# Patient Record
Sex: Female | Born: 1985 | Race: White | Hispanic: No | Marital: Single | Smoking: Current every day smoker
Health system: Southern US, Community
[De-identification: ages and names within clinical notes are randomized; demographics above are authoritative.]

## PROBLEM LIST (undated history)

## (undated) DIAGNOSIS — C539 Malignant neoplasm of cervix uteri, unspecified: Secondary | ICD-10-CM

## (undated) HISTORY — PX: TONSILLECTOMY: SUR1361

## (undated) HISTORY — PX: ABDOMINAL HYSTERECTOMY: SHX81

## (undated) HISTORY — DX: Malignant neoplasm of cervix uteri, unspecified: C53.9

## (undated) HISTORY — PX: CHOLECYSTECTOMY: SHX55

---

## 2004-05-11 ENCOUNTER — Emergency Department (HOSPITAL_COMMUNITY): Admission: EM | Admit: 2004-05-11 | Discharge: 2004-05-11 | Payer: Self-pay | Admitting: Emergency Medicine

## 2004-05-18 ENCOUNTER — Emergency Department (HOSPITAL_COMMUNITY): Admission: EM | Admit: 2004-05-18 | Discharge: 2004-05-18 | Payer: Self-pay | Admitting: Emergency Medicine

## 2006-06-20 ENCOUNTER — Inpatient Hospital Stay (HOSPITAL_COMMUNITY): Admission: AD | Admit: 2006-06-20 | Discharge: 2006-06-20 | Payer: Self-pay | Admitting: Gynecology

## 2006-06-20 ENCOUNTER — Ambulatory Visit: Payer: Self-pay | Admitting: *Deleted

## 2006-06-21 ENCOUNTER — Ambulatory Visit (HOSPITAL_COMMUNITY): Admission: RE | Admit: 2006-06-21 | Discharge: 2006-06-21 | Payer: Self-pay | Admitting: Gynecology

## 2006-09-29 ENCOUNTER — Inpatient Hospital Stay (HOSPITAL_COMMUNITY): Admission: AD | Admit: 2006-09-29 | Discharge: 2006-09-30 | Payer: Self-pay | Admitting: Family Medicine

## 2006-09-29 ENCOUNTER — Ambulatory Visit: Payer: Self-pay | Admitting: *Deleted

## 2007-06-09 ENCOUNTER — Emergency Department (HOSPITAL_COMMUNITY): Admission: EM | Admit: 2007-06-09 | Discharge: 2007-06-09 | Payer: Self-pay | Admitting: Emergency Medicine

## 2010-07-29 ENCOUNTER — Emergency Department (HOSPITAL_COMMUNITY)
Admission: EM | Admit: 2010-07-29 | Discharge: 2010-07-30 | Payer: Self-pay | Source: Home / Self Care | Admitting: Emergency Medicine

## 2010-09-04 ENCOUNTER — Encounter: Payer: Self-pay | Admitting: Gynecology

## 2010-10-24 LAB — URINALYSIS, ROUTINE W REFLEX MICROSCOPIC
Bilirubin Urine: NEGATIVE
Glucose, UA: NEGATIVE mg/dL
Hgb urine dipstick: NEGATIVE
Ketones, ur: NEGATIVE mg/dL
Nitrite: NEGATIVE
Protein, ur: NEGATIVE mg/dL
Specific Gravity, Urine: 1.025 (ref 1.005–1.030)
Urobilinogen, UA: 0.2 mg/dL (ref 0.0–1.0)
pH: 6.5 (ref 5.0–8.0)

## 2010-10-24 LAB — SYPHILIS: RPR W/REFLEX TO RPR TITER AND TREPONEMAL ANTIBODIES, TRADITIONAL SCREENING AND DIAGNOSIS ALGORITHM: RPR Ser Ql: NONREACTIVE

## 2010-10-24 LAB — WET PREP, GENITAL

## 2010-10-24 LAB — PREGNANCY, URINE: Preg Test, Ur: POSITIVE

## 2010-10-24 LAB — ABO/RH: ABO/RH(D): A POS

## 2010-10-24 LAB — HCG, QUANTITATIVE, PREGNANCY: hCG, Beta Chain, Quant, S: 36091 m[IU]/mL — ABNORMAL HIGH (ref <5)

## 2011-05-24 LAB — CBC
HCT: 36.7
MCV: 84.1
Platelets: 242
RDW: 17.2 — ABNORMAL HIGH

## 2011-05-24 LAB — DIFFERENTIAL
Basophils Absolute: 0
Eosinophils Absolute: 0.3
Eosinophils Relative: 4
Lymphs Abs: 2.7

## 2011-05-24 LAB — URINALYSIS, ROUTINE W REFLEX MICROSCOPIC
Hgb urine dipstick: NEGATIVE
Ketones, ur: >80 — AB
Nitrite: NEGATIVE
pH: 6.5

## 2011-05-24 LAB — BASIC METABOLIC PANEL
BUN: 6
Chloride: 105
Glucose, Bld: 127 — ABNORMAL HIGH
Potassium: 3.7

## 2011-05-24 LAB — HCG, QUANTITATIVE, PREGNANCY: hCG, Beta Chain, Quant, S: 51015 — ABNORMAL HIGH

## 2011-05-24 LAB — URINE MICROSCOPIC-ADD ON

## 2017-04-26 ENCOUNTER — Encounter (HOSPITAL_COMMUNITY): Payer: Self-pay | Admitting: *Deleted

## 2017-04-26 ENCOUNTER — Emergency Department (HOSPITAL_COMMUNITY)
Admission: EM | Admit: 2017-04-26 | Discharge: 2017-04-26 | Disposition: A | Discharge status: Home / Self Care | Payer: Medicaid - Out of State | Attending: Emergency Medicine | Admitting: Emergency Medicine

## 2017-04-26 ENCOUNTER — Emergency Department (HOSPITAL_COMMUNITY): Payer: Medicaid - Out of State

## 2017-04-26 DIAGNOSIS — M79673 Pain in unspecified foot: Secondary | ICD-10-CM

## 2017-04-26 DIAGNOSIS — M79672 Pain in left foot: Secondary | ICD-10-CM | POA: Diagnosis not present

## 2017-04-26 DIAGNOSIS — F17200 Nicotine dependence, unspecified, uncomplicated: Secondary | ICD-10-CM | POA: Diagnosis not present

## 2017-04-26 NOTE — ED Provider Notes (Signed)
[facility] DEPT Provider Note   CSN: [id number] Arrival date & time: 04/26/17  1511     History   Chief Complaint Chief Complaint  Patient presents with  . Foot Pain    HPI Vannary A Sidman is a 31 y.o. female who presents to the emergency department with a chief complaint of left foot pain that began 3 weeks ago. She denies known trauma or injury. She states that she thinks the pain began after she was moving a few weeks ago, but is unsure. She reports the pain has been constant and has gradually worsened. She reports her pain is alleviated by resting the foot and aggravated by bearing weight. She denies left ankle or knee pain. No fever or chills. No treatment prior to arrival.  The history is provided by the patient. No language interpreter was used.    History reviewed. No pertinent past medical history.  There are no active problems to display for this patient.   Past Surgical History:  Procedure Laterality Date  . ABDOMINAL HYSTERECTOMY    . CHOLECYSTECTOMY      OB History    No data available       Home Medications    Prior to Admission medications   Not on File    Family History No family history on file.  Social History Social History  Substance Use Topics  . Smoking status: Current Every Day Smoker  . Smokeless tobacco: Never Used  . Alcohol use No     Allergies   Patient has no known allergies.   Review of Systems Review of Systems  Constitutional: Negative for activity change, chills and fever.  Respiratory: Negative for shortness of breath.   Cardiovascular: Negative for chest pain.  Gastrointestinal: Negative for abdominal pain.  Musculoskeletal: Positive for arthralgias, gait problem and myalgias. Negative for back pain and joint swelling.  Skin: Negative for rash.     Physical Exam Updated Vital Signs BP 124/72   Pulse (!) 123   Temp 98.4 F (36.9 C) (Oral)   Resp 18   Ht 5\' 6"  (1.676 m)   Wt 104.3 kg (230 lb)   SpO2  99%   BMI 37.12 kg/m   Physical Exam  Constitutional: No distress.  HENT:  Head: Normocephalic.  Eyes: Conjunctivae are normal.  Neck: Neck supple.  Cardiovascular: Normal rate and regular rhythm.  Exam reveals no gallop and no friction rub.   No murmur heard. Pulmonary/Chest: Effort normal. No respiratory distress.  Abdominal: Soft. She exhibits no distension.  Musculoskeletal:  Tender to palpation over the dorsum of the left foot that begins over the lateral, ulnar aspect and extends diagonally across to the medial aspect, near the patient's hallux. The base of the fifth metatarsal is also TTP. DP and PT pulses are 2+ and symmetric. 5 out of 5 strength against resistance with dorsiflexion and plantar flexion of the bilateral lower extremities. Sensation is intact throughout the bilateral feet. Full range of motion of the bilateral ankles and knees without tenderness.  Neurological: She is alert.  Skin: Skin is warm. No rash noted.  Psychiatric: Her behavior is normal.  Nursing note and vitals reviewed.    ED Treatments / Results  Labs (all labs ordered are listed, but only abnormal results are displayed) Labs Reviewed - No data to display  EKG  EKG Interpretation None       Radiology Dg Foot Complete Left  Result Date: 04/26/2017 CLINICAL DATA:  Left foot pain for 3 weeks  without known injury. EXAM: LEFT FOOT - COMPLETE 3+ VIEW COMPARISON:  None. FINDINGS: There is no evidence of fracture or dislocation. There is no evidence of arthropathy or other focal bone abnormality. Soft tissues are unremarkable. IMPRESSION: Normal left foot. Electronically Signed   By: Marijo Conception, M.D.   On: 04/26/2017 17:01    Procedures Procedures (including critical care time)  Medications Ordered in ED Medications - No data to display   Initial Impression / Assessment and Plan / ED Course  I have reviewed the triage vital signs and the nursing notes.  Pertinent labs & imaging  results that were available during my care of the patient were reviewed by me and considered in my medical decision making (see chart for details).    Patient X-Ray negative for obvious fracture or dislocation. Pt advised to follow up with orthopedics or podiatry once she returns to [place] if symptoms persist. Patient given ace wrap while in ED, conservative therapy recommended and discussed. Patient will be dc home & is agreeable with above plan.  Final Clinical Impressions(s) / ED Diagnoses   Final diagnoses:  Foot pain    New Prescriptions New Prescriptions   No medications on file     Perrin Smack 04/26/17 1716    Nat Christen, MD 04/28/17 1212

## 2017-04-26 NOTE — Discharge Instructions (Signed)
Please follow-up with a podiatrist or orthopedist once he returned to [place].  You can continue to take anti-inflammatories, apply ice, and to wear the Ace bandage for comfort.  If you develop any new or worsening symptoms, including a new injury, if the foot becomes red, hot, or swollen, or if you develop weakness or numbness in the leg, please return to emergency department for reevaluation.

## 2017-04-26 NOTE — ED Triage Notes (Signed)
Left foot pain for 3 weeks

## 2018-08-19 ENCOUNTER — Emergency Department (HOSPITAL_COMMUNITY): Payer: Medicaid Other

## 2018-08-19 ENCOUNTER — Emergency Department (HOSPITAL_COMMUNITY)
Admission: EM | Admit: 2018-08-19 | Discharge: 2018-08-19 | Disposition: A | Discharge status: Home / Self Care | Payer: Medicaid Other | Attending: Emergency Medicine | Admitting: Emergency Medicine

## 2018-08-19 ENCOUNTER — Encounter (HOSPITAL_COMMUNITY): Payer: Self-pay | Admitting: Emergency Medicine

## 2018-08-19 DIAGNOSIS — F17210 Nicotine dependence, cigarettes, uncomplicated: Secondary | ICD-10-CM | POA: Insufficient documentation

## 2018-08-19 DIAGNOSIS — N83202 Unspecified ovarian cyst, left side: Secondary | ICD-10-CM | POA: Insufficient documentation

## 2018-08-19 DIAGNOSIS — R1032 Left lower quadrant pain: Secondary | ICD-10-CM | POA: Diagnosis present

## 2018-08-19 LAB — CBC WITH DIFFERENTIAL/PLATELET
Abs Immature Granulocytes: 0.03 10*3/uL (ref 0.00–0.07)
BASOS PCT: 0 %
Basophils Absolute: 0 10*3/uL (ref 0.0–0.1)
Eosinophils Absolute: 0.3 10*3/uL (ref 0.0–0.5)
Eosinophils Relative: 3 %
HEMATOCRIT: 40 % (ref 36.0–46.0)
HEMOGLOBIN: 13.3 g/dL (ref 12.0–15.0)
Immature Granulocytes: 0 %
LYMPHS ABS: 3 10*3/uL (ref 0.7–4.0)
Lymphocytes Relative: 29 %
MCH: 30.6 pg (ref 26.0–34.0)
MCHC: 33.3 g/dL (ref 30.0–36.0)
MCV: 92.2 fL (ref 80.0–100.0)
MONO ABS: 0.6 10*3/uL (ref 0.1–1.0)
MONOS PCT: 6 %
Neutro Abs: 6.3 10*3/uL (ref 1.7–7.7)
Neutrophils Relative %: 62 %
PLATELETS: 220 10*3/uL (ref 150–400)
RBC: 4.34 MIL/uL (ref 3.87–5.11)
RDW: 13.3 % (ref 11.5–15.5)
WBC: 10.2 10*3/uL (ref 4.0–10.5)
nRBC: 0 % (ref 0.0–0.2)

## 2018-08-19 LAB — COMPREHENSIVE METABOLIC PANEL
ALK PHOS: 64 U/L (ref 38–126)
ALT: 32 U/L (ref 0–44)
AST: 22 U/L (ref 15–41)
Albumin: 3.8 g/dL (ref 3.5–5.0)
Anion gap: 7 (ref 5–15)
BILIRUBIN TOTAL: 0.7 mg/dL (ref 0.3–1.2)
BUN: 13 mg/dL (ref 6–20)
CALCIUM: 9.1 mg/dL (ref 8.9–10.3)
CO2: 23 mmol/L (ref 22–32)
CREATININE: 0.62 mg/dL (ref 0.44–1.00)
Chloride: 106 mmol/L (ref 98–111)
Glucose, Bld: 97 mg/dL (ref 70–99)
Potassium: 3.7 mmol/L (ref 3.5–5.1)
Sodium: 136 mmol/L (ref 135–145)
TOTAL PROTEIN: 6.6 g/dL (ref 6.5–8.1)

## 2018-08-19 LAB — URINALYSIS, ROUTINE W REFLEX MICROSCOPIC
Bilirubin Urine: NEGATIVE
GLUCOSE, UA: NEGATIVE mg/dL
HGB URINE DIPSTICK: NEGATIVE
Ketones, ur: 5 mg/dL — AB
Leukocytes, UA: NEGATIVE
Nitrite: NEGATIVE
PROTEIN: NEGATIVE mg/dL
Specific Gravity, Urine: 1.033 — ABNORMAL HIGH (ref 1.005–1.030)
pH: 5 (ref 5.0–8.0)

## 2018-08-19 MED ORDER — TRAMADOL HCL 50 MG PO TABS: 50.0000 mg | ORAL_TABLET | Freq: Four times a day (QID) | ORAL | 0 refills | Status: DC | PRN

## 2018-08-19 MED ORDER — IOPAMIDOL (ISOVUE-300) INJECTION 61%
100.0000 mL | Freq: Once | INTRAVENOUS | Status: AC | PRN
  Administered 2018-08-19: 100 mL via INTRAVENOUS

## 2018-08-19 MED ORDER — SODIUM CHLORIDE 0.9 % IV BOLUS
500.0000 mL | Freq: Once | INTRAVENOUS | Status: AC
  Administered 2018-08-19: 500 mL via INTRAVENOUS

## 2018-08-19 MED ORDER — ONDANSETRON HCL 4 MG/2ML IJ SOLN
4.0000 mg | Freq: Once | INTRAMUSCULAR | Status: AC
  Administered 2018-08-19: 4 mg via INTRAVENOUS
  Filled 2018-08-19: qty 2

## 2018-08-19 MED ORDER — HYDROMORPHONE HCL 1 MG/ML IJ SOLN
0.5000 mg | Freq: Once | INTRAMUSCULAR | Status: AC
  Administered 2018-08-19: 0.5 mg via INTRAVENOUS
  Filled 2018-08-19: qty 1

## 2018-08-19 NOTE — ED Notes (Signed)
Patient transported to CT 

## 2018-08-19 NOTE — Discharge Instructions (Addendum)
Follow-up with Dr. Glo Herring in the next couple weeks for recheck

## 2018-08-19 NOTE — ED Provider Notes (Signed)
[facility] EMERGENCY DEPARTMENT Provider Note   CSN: [id number] Arrival date & time: 08/19/18  4196     History   Chief Complaint Chief Complaint  Patient presents with  . Abdominal Pain    HPI Marilyn Lopez is a 33 y.o. female.  Patient complains of lower abdominal pain.  Patient has a history of having hysterectomy  The history is provided by the patient. No language interpreter was used.  Abdominal Pain   This is a new problem. The current episode started more than 2 days ago. The problem occurs constantly. The problem has not changed since onset.The pain is associated with an unknown factor. The pain is located in the LLQ and RLQ. The quality of the pain is aching. The pain is at a severity of 5/10. The pain is moderate. Pertinent negatives include anorexia, diarrhea, frequency, hematuria and headaches.    History reviewed. No pertinent past medical history.  There are no active problems to display for this patient.   Past Surgical History:  Procedure Laterality Date  . ABDOMINAL HYSTERECTOMY    . CHOLECYSTECTOMY       OB History   No obstetric history on file.      Home Medications    Prior to Admission medications   Medication Sig Start Date End Date Taking? Authorizing Provider  traMADol (ULTRAM) 50 MG tablet Take 1 tablet (50 mg total) by mouth every 6 (six) hours as needed. 08/19/18   Milton Ferguson, MD    Family History History reviewed. No pertinent family history.  Social History Social History   Tobacco Use  . Smoking status: Current Every Day Smoker    Packs/day: 0.50  . Smokeless tobacco: Never Used  Substance Use Topics  . Alcohol use: No  . Drug use: No     Allergies   Patient has no known allergies.   Review of Systems Review of Systems  Constitutional: Negative for appetite change and fatigue.  HENT: Negative for congestion, ear discharge and sinus pressure.   Eyes: Negative for discharge.  Respiratory: Negative for cough.    Cardiovascular: Negative for chest pain.  Gastrointestinal: Positive for abdominal pain. Negative for anorexia and diarrhea.  Genitourinary: Negative for frequency and hematuria.  Musculoskeletal: Negative for back pain.  Skin: Negative for rash.  Neurological: Negative for seizures and headaches.  Psychiatric/Behavioral: Negative for hallucinations.     Physical Exam Updated Vital Signs BP (!) 126/97 (BP Location: Right Arm)   Pulse 79   Temp 98 F (36.7 C) (Oral)   Resp 18   Ht 5\' 6"  (1.676 m)   Wt 93.4 kg   SpO2 100%   BMI 33.25 kg/m   Physical Exam Vitals signs reviewed.  Constitutional:      Appearance: She is well-developed.  HENT:     Head: Normocephalic.     Nose: Nose normal.  Eyes:     General: No scleral icterus.    Conjunctiva/sclera: Conjunctivae normal.  Neck:     Musculoskeletal: Neck supple.     Thyroid: No thyromegaly.  Cardiovascular:     Rate and Rhythm: Normal rate and regular rhythm.     Heart sounds: No murmur. No friction rub. No gallop.   Pulmonary:     Breath sounds: No stridor. No wheezing or rales.  Chest:     Chest wall: No tenderness.  Abdominal:     General: There is no distension.     Tenderness: There is abdominal tenderness. There is no rebound.  Musculoskeletal: Normal range of motion.  Lymphadenopathy:     Cervical: No cervical adenopathy.  Skin:    Findings: No erythema or rash.  Neurological:     Mental Status: She is oriented to person, place, and time.     Motor: No abnormal muscle tone.     Coordination: Coordination normal.  Psychiatric:        Behavior: Behavior normal.      ED Treatments / Results  Labs (all labs ordered are listed, but only abnormal results are displayed) Labs Reviewed  URINALYSIS, ROUTINE W REFLEX MICROSCOPIC - Abnormal; Notable for the following components:      Result Value   Color, Urine AMBER (*)    APPearance CLOUDY (*)    Specific Gravity, Urine 1.033 (*)    Ketones, ur 5 (*)      All other components within normal limits  CBC WITH DIFFERENTIAL/PLATELET  COMPREHENSIVE METABOLIC PANEL    EKG None  Radiology Ct Abdomen Pelvis W Contrast  Result Date: 08/19/2018 CLINICAL DATA:  Lower pelvic pain.  Abdominal distention. EXAM: CT ABDOMEN AND PELVIS WITH CONTRAST TECHNIQUE: Multidetector CT imaging of the abdomen and pelvis was performed using the standard protocol following bolus administration of intravenous contrast. CONTRAST:  132mL ISOVUE-300 IOPAMIDOL (ISOVUE-300) INJECTION 61% COMPARISON:  None. FINDINGS: Lower chest: Dependent atelectasis. No effusions. Heart is borderline in size. Hepatobiliary: Prior cholecystectomy. Small enhancing area peripherally in the right hepatic lobe measures 9 mm, likely benign process such as hemangioma. No biliary ductal dilatation. Pancreas: No focal abnormality or ductal dilatation. Spleen: No focal abnormality.  Normal size. Adrenals/Urinary Tract: 3 mm stone in the mid to lower pole of the right kidney. No ureteral stones or hydronephrosis bilaterally. Urinary bladder and adrenal glands unremarkable. Stomach/Bowel: Normal appendix. Stomach, large and small bowel grossly unremarkable. Vascular/Lymphatic: No evidence of aneurysm or adenopathy. Reproductive: Prior hysterectomy. 3.5 cm cystic area in the left ovary, likely functional cyst. Other: No free fluid or free air. Musculoskeletal: No acute bony abnormality. IMPRESSION: 3.5 cm left ovarian cyst, likely functional cyst. No acute findings in the abdomen or pelvis. Electronically Signed   By: Rolm Baptise M.D.   On: 08/19/2018 12:24    Procedures Procedures (including critical care time)  Medications Ordered in ED Medications  sodium chloride 0.9 % bolus 500 mL (0 mLs Intravenous Stopped 08/19/18 1207)  ondansetron (ZOFRAN) injection 4 mg (4 mg Intravenous Given 08/19/18 1107)  HYDROmorphone (DILAUDID) injection 0.5 mg (0.5 mg Intravenous Given 08/19/18 1107)  iopamidol (ISOVUE-300) 61 %  injection 100 mL (100 mLs Intravenous Contrast Given 08/19/18 1212)     Initial Impression / Assessment and Plan / ED Course  I have reviewed the triage vital signs and the nursing notes.  Pertinent labs & imaging results that were available during my care of the patient were reviewed by me and considered in my medical decision making (see chart for details).     Labs and urinalysis unremarkable.  CT scan shows large ovarian cyst.  Suspect pain related to her ovarian cyst.  Patient given pain medicine and referred to OB/GYN  Final Clinical Impressions(s) / ED Diagnoses   Final diagnoses:  Cyst of left ovary    ED Discharge Orders         Ordered    traMADol (ULTRAM) 50 MG tablet  Every 6 hours PRN     08/19/18 1259           Milton Ferguson, MD 08/19/18 1304

## 2018-08-19 NOTE — ED Triage Notes (Signed)
Pt states she has been having low pelvic pain since Thursday.  Denies dysuria, hematuria, vaginal discharge, or bleeding.

## 2018-08-19 NOTE — ED Notes (Signed)
ED Provider at bedside. 

## 2018-08-19 NOTE — ED Notes (Signed)
Attempted IV x 1 in right AC, unsuccessful.

## 2018-09-04 ENCOUNTER — Ambulatory Visit: Payer: Medicaid Other | Admitting: Obstetrics and Gynecology

## 2018-09-04 ENCOUNTER — Encounter: Payer: Self-pay | Admitting: Obstetrics and Gynecology

## 2018-09-04 DIAGNOSIS — Z90721 Acquired absence of ovaries, unilateral: Secondary | ICD-10-CM | POA: Diagnosis not present

## 2018-09-04 DIAGNOSIS — Z9079 Acquired absence of other genital organ(s): Secondary | ICD-10-CM | POA: Diagnosis not present

## 2018-09-04 DIAGNOSIS — Z90710 Acquired absence of both cervix and uterus: Secondary | ICD-10-CM | POA: Diagnosis not present

## 2018-09-04 DIAGNOSIS — N83202 Unspecified ovarian cyst, left side: Secondary | ICD-10-CM | POA: Diagnosis not present

## 2018-09-04 NOTE — Progress Notes (Signed)
Patient ID: Marilyn Lopez, female   DOB: 1985-12-23, 33 y.o.   MRN: [id number]   [facility] Visit  @DATE @            Patient name: Marilyn Lopez MRN [id number]  Date of birth: 1986/07/07  CC & HPI:  Marilyn Lopez is a 33 y.o. female presenting today for er f/u of left ovarian cyst shown on CT scan 08/19/2018. She had hysterectomy roughly 4 years ago. She had a partially as well as her right ovary, due to getting "polyps"on the right ovary , (her story) in [place] by Dr. Lacinda Axon . She believed that both of her ovaries were being removed instead of one. Recent episode that sent her to the e/r with pain on left side. Is mildly sore today at present appt and takes around 4 ibuprofen for pain , Almost daily for generalized aches  CT ABDOMEN AND PELVIS:  IMPRESSION: Prior hysterectomy, 3.5 cm left ovarian cyst, likely functional cyst. No acute findings in the abdomen or pelvis. Electronically Signed   By: Rolm Baptise M.D.   On: 08/19/2018 12:24 ROS:  ROS +left functional ovarian cyst -fever  All systems are negative except as noted in the HPI and PMH.   Pertinent History Reviewed:   Reviewed: hysterectomy Medical         Past Medical History:  Diagnosis Date  . Cervical cancer [facility])                               Surgical Hx:    Past Surgical History:  Procedure Laterality Date  . ABDOMINAL HYSTERECTOMY    . CHOLECYSTECTOMY     Medications: Reviewed & Updated - see associated section                       Current Outpatient Medications:  .  traMADol (ULTRAM) 50 MG tablet, Take 1 tablet (50 mg total) by mouth every 6 (six) hours as needed. (Patient not taking: Reported on 09/04/2018), Disp: 20 tablet, Rfl: 0   Social History: Reviewed -  reports that she has been smoking. She has been smoking about 0.50 packs per day. She has never used smokeless tobacco.  Objective Findings:  Vitals: There were no vitals taken for this visit.  PHYSICAL EXAMINATION General  appearance - alert, well appearing, and in no distress and oriented to person, place, and time Mental status - normal mood, behavior, speech, dress, motor activity, and thought processes, affect appropriate to mood  PELVIC NOT DONE Cervix - surgically absent, discussed surgery Uterus - surgically absent, discussed surgery   Assessment & Plan:   A:  1. Left ovarian cyst presumed functional ovarian cyst  P:  1. Estradiol pill 1 mg, daily x6 weeks to see if it reduces the discomfort on the left.  Patient is aware that further interventions are possible including laparoscopy but that that seems excessive at this time 2. F/u PRN   By signing my name below, I, Samul Dada, attest that this documentation has been prepared under the direction and in the presence of Jonnie Kind, MD. Electronically Signed: Milton. 09/04/18. 8:45 AM.  I personally performed the services described in this documentation, which was SCRIBED in my presence. The recorded information has been reviewed and considered accurate. It has been edited as necessary during review. Jonnie Kind, MD

## 2020-05-10 ENCOUNTER — Emergency Department (HOSPITAL_COMMUNITY): Payer: Medicaid Other

## 2020-05-10 ENCOUNTER — Emergency Department (HOSPITAL_COMMUNITY)
Admission: EM | Admit: 2020-05-10 | Discharge: 2020-05-10 | Disposition: A | Discharge status: Home / Self Care | Payer: Medicaid Other | Attending: Emergency Medicine | Admitting: Emergency Medicine

## 2020-05-10 ENCOUNTER — Other Ambulatory Visit: Payer: Self-pay

## 2020-05-10 ENCOUNTER — Encounter (HOSPITAL_COMMUNITY): Payer: Self-pay | Admitting: *Deleted

## 2020-05-10 DIAGNOSIS — R430 Anosmia: Secondary | ICD-10-CM | POA: Diagnosis not present

## 2020-05-10 DIAGNOSIS — R5381 Other malaise: Secondary | ICD-10-CM | POA: Insufficient documentation

## 2020-05-10 DIAGNOSIS — R509 Fever, unspecified: Secondary | ICD-10-CM | POA: Insufficient documentation

## 2020-05-10 DIAGNOSIS — Z8541 Personal history of malignant neoplasm of cervix uteri: Secondary | ICD-10-CM | POA: Insufficient documentation

## 2020-05-10 DIAGNOSIS — B349 Viral infection, unspecified: Secondary | ICD-10-CM

## 2020-05-10 DIAGNOSIS — Z20822 Contact with and (suspected) exposure to covid-19: Secondary | ICD-10-CM | POA: Diagnosis not present

## 2020-05-10 DIAGNOSIS — R05 Cough: Secondary | ICD-10-CM | POA: Insufficient documentation

## 2020-05-10 DIAGNOSIS — F17200 Nicotine dependence, unspecified, uncomplicated: Secondary | ICD-10-CM | POA: Diagnosis not present

## 2020-05-10 DIAGNOSIS — R0789 Other chest pain: Secondary | ICD-10-CM | POA: Diagnosis not present

## 2020-05-10 DIAGNOSIS — R0981 Nasal congestion: Secondary | ICD-10-CM | POA: Diagnosis present

## 2020-05-10 LAB — RESPIRATORY PANEL BY RT PCR (FLU A&B, COVID)
Influenza A by PCR: NEGATIVE
Influenza B by PCR: NEGATIVE
SARS Coronavirus 2 by RT PCR: NEGATIVE

## 2020-05-10 MED ORDER — BENZONATATE 200 MG PO CAPS: 200.0000 mg | ORAL_CAPSULE | Freq: Three times a day (TID) | ORAL | 0 refills | Status: DC | PRN

## 2020-05-10 MED ORDER — ALBUTEROL SULFATE HFA 108 (90 BASE) MCG/ACT IN AERS
2.0000 | INHALATION_SPRAY | Freq: Once | RESPIRATORY_TRACT | Status: AC
  Administered 2020-05-10: 2 via RESPIRATORY_TRACT
  Filled 2020-05-10: qty 6.7

## 2020-05-10 NOTE — Discharge Instructions (Signed)
Your Covid test is pending.  Your results should be available later today or tomorrow.  You may review your results on the MyChart app.  If your Covid test is positive, someone work from the hospital will notify you.  Please quarantine at home for at least 10 days.  You may take Tylenol or ibuprofen as directed if needed for body aches or fever.  Follow-up with your primary care provider or return to the emergency department if you develop any worsening symptoms such as increasing chest pain or shortness of breath.

## 2020-05-10 NOTE — ED Provider Notes (Signed)
[facility] EMERGENCY DEPARTMENT Provider Note   CSN: [id number] Arrival date & time: 05/10/20  8182     History Chief Complaint  Patient presents with  . Nasal Congestion    Marilyn Lopez is a 34 y.o. female.  HPI      Marilyn Lopez is a 34 y.o. female who presents to the Emergency Department complaining of cough, nasal congestion, generalized body aches and malaise.  Symptoms began 3 days ago.  She also reports a loss of smell and has a metallic taste in her mouth.  She reports some tightness of her chest associated with cough, no chest pain or shortness of breath, no fever or chills, no abominal pain, vomiting or diarrhea.  No other household members are sick, she has not been vaccinated for the COVID-19 virus.  She reports history of pneumonia last year that required hospitalization, but does not feel that her symptoms this time are as severe.   Past Medical History:  Diagnosis Date  . Cervical cancer [facility])     Patient Active Problem List   Diagnosis Date Noted  . Ovarian cyst, left 09/04/2018  . S/P abdominal hysterectomy and right salpingo-oophorectomy 09/04/2018    Past Surgical History:  Procedure Laterality Date  . ABDOMINAL HYSTERECTOMY    . CHOLECYSTECTOMY    . TONSILLECTOMY       OB History    Gravida  4   Para  4   Term  4   Preterm      AB      Living  4     SAB      TAB      Ectopic      Multiple      Live Births              Family History  Problem Relation Age of Onset  . Heart disease Father   . Cancer Mother     Social History   Tobacco Use  . Smoking status: Current Every Day Smoker    Packs/day: 0.50  . Smokeless tobacco: Never Used  Vaping Use  . Vaping Use: Never used  Substance Use Topics  . Alcohol use: No  . Drug use: No    Home Medications Prior to Admission medications   Medication Sig Start Date End Date Taking? Authorizing Provider  traMADol (ULTRAM) 50 MG tablet Take 1 tablet (50 mg  total) by mouth every 6 (six) hours as needed. Patient not taking: Reported on 09/04/2018 08/19/18   Milton Ferguson, MD    Allergies    Patient has no known allergies.  Review of Systems   Review of Systems  Constitutional: Negative for appetite change, chills and fever.  HENT: Positive for congestion and rhinorrhea. Negative for ear pain, sore throat and trouble swallowing.   Respiratory: Positive for cough and chest tightness. Negative for shortness of breath and wheezing.   Cardiovascular: Negative for chest pain.  Gastrointestinal: Negative for abdominal pain, diarrhea, nausea and vomiting.  Genitourinary: Negative for decreased urine volume and dysuria.  Musculoskeletal: Negative for arthralgias.  Skin: Negative for rash.  Neurological: Negative for dizziness, weakness and numbness.  Hematological: Negative for adenopathy.    Physical Exam Updated Vital Signs BP 138/86 (BP Location: Right Arm)   Pulse 87   Temp 99.5 F (37.5 C) (Oral)   Resp 18   Ht 5\' 6"  (1.676 m)   Wt 95.3 kg   SpO2 96%   BMI 33.89 kg/m   Physical  Exam Vitals and nursing note reviewed.  Constitutional:      Appearance: Normal appearance. She is not ill-appearing or toxic-appearing.  HENT:     Right Ear: Tympanic membrane and ear canal normal.     Left Ear: Tympanic membrane and ear canal normal.     Mouth/Throat:     Mouth: Mucous membranes are moist.     Pharynx: No oropharyngeal exudate or posterior oropharyngeal erythema.     Comments: Uvula midline nonedematous Cardiovascular:     Rate and Rhythm: Normal rate and regular rhythm.     Pulses: Normal pulses.  Pulmonary:     Effort: Pulmonary effort is normal. No respiratory distress.     Breath sounds: No wheezing or rhonchi.  Abdominal:     Palpations: Abdomen is soft.     Tenderness: There is no abdominal tenderness.  Musculoskeletal:        General: Normal range of motion.     Cervical back: No tenderness.  Lymphadenopathy:      Cervical: No cervical adenopathy.  Skin:    General: Skin is warm.     Findings: No rash.  Neurological:     General: No focal deficit present.     Mental Status: She is alert.     Sensory: No sensory deficit.     Motor: No weakness.     ED Results / Procedures / Treatments   Labs (all labs ordered are listed, but only abnormal results are displayed) Labs Reviewed  RESPIRATORY PANEL BY RT PCR (FLU A&B, COVID)    EKG None  Radiology DG Chest Portable 1 View  Result Date: 05/10/2020 CLINICAL DATA:  Cough and nasal congestion EXAM: PORTABLE CHEST 1 VIEW COMPARISON:  None. FINDINGS: Lungs are well aerated bilaterally. No focal infiltrate or sizable effusion is seen. No bony abnormality is noted. IMPRESSION: No active disease. Electronically Signed   By: Inez Catalina M.D.   On: 05/10/2020 09:59    Procedures Procedures (including critical care time)  Medications Ordered in ED Medications - No data to display  ED Course  I have reviewed the triage vital signs and the nursing notes.  Pertinent labs & imaging results that were available during my care of the patient were reviewed by me and considered in my medical decision making (see chart for details).    MDM Rules/Calculators/A&P                          Patient here with symptoms concerning for Covid infection.  She is well-appearing, nontoxic.  Vital signs are reassuring.  Low-grade fever without tachycardia, hypoxia or tachypnea.  Lung sounds are slightly coarse, has history of pneumonia last year that required hospitalization.  Will obtain Covid test and chest x-ray.  On recheck, patient resting comfortably.  States she is ready for discharge home.  Chest x-ray unremarkable, Covid and influenza tests are pending.  She agrees to review results on the MyChart app.  Will dispense albuterol MDI for home use and prescription written for antitussive medication.  Return precautions were discussed.  She is appropriate for  discharge home.  Upon disposition, Covid and flu test have resulted and are negative.  Results discussed with patient.     Marilyn Lopez was evaluated in Emergency Department on 05/10/2020 for the symptoms described in the history of present illness. She was evaluated in the context of the global COVID-19 pandemic, which necessitated consideration that the patient might be at risk  for infection with the SARS-CoV-2 virus that causes COVID-19. Institutional protocols and algorithms that pertain to the evaluation of patients at risk for COVID-19 are in a state of rapid change based on information released by regulatory bodies including the CDC and federal and state organizations. These policies and algorithms were followed during the patient's care in the ED.  Final Clinical Impression(s) / ED Diagnoses Final diagnoses:  None    Rx / DC Orders ED Discharge Orders    None       Bufford Lope 05/10/20 1739    Noemi Chapel, MD 05/15/20 [phone]

## 2020-05-10 NOTE — ED Triage Notes (Signed)
Pt c/o nasal congestion and "copper" taste in her mouth x 3 days ago

## 2020-08-14 IMAGING — CT CT ABD-PELV W/ CM
2 of 4 series · 17 of 46 positions shown, 19 images · IV contrast (Isovue)
Comparison: None.

CLINICAL DATA: Lower pelvic pain.  Abdominal distention.

EXAM:
CT ABDOMEN AND PELVIS WITH CONTRAST
TECHNIQUE: Multidetector CT imaging of the abdomen and pelvis was performed
using the standard protocol following bolus administration of
intravenous contrast.
CONTRAST:  100mL HJ9IL0-8HH IOPAMIDOL (HJ9IL0-8HH) INJECTION 61%

[Series 2: axial st · axial · 0.74mm/px · z∈[+908,+1373]mm · 14 of 103 slices shown, 16 images]
[im 5/103  soft-tissue]
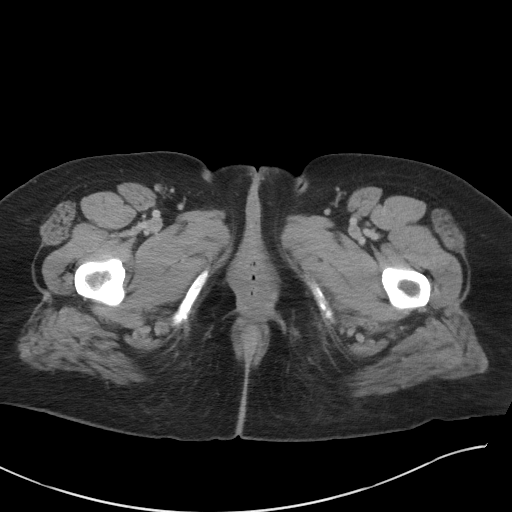
[im 5/103  bone]
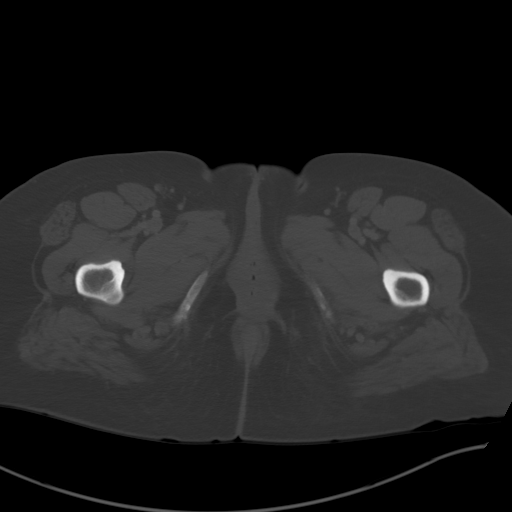
[im 14/103  soft-tissue]
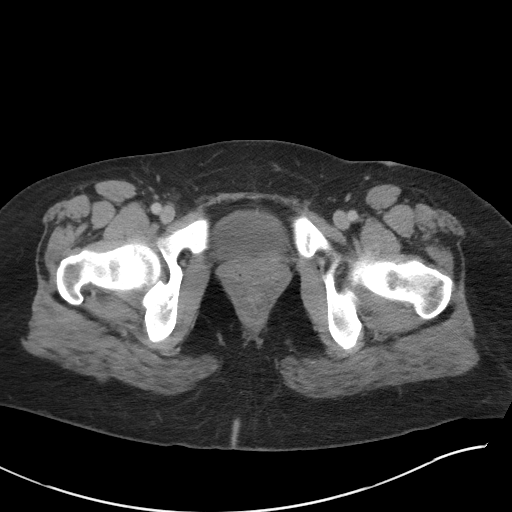
[im 18/103  soft-tissue]
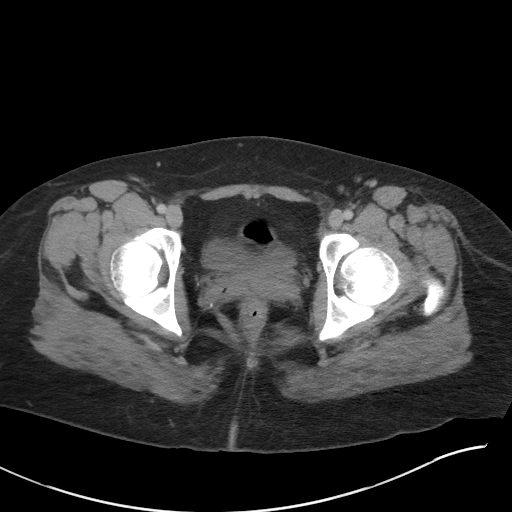
[im 27/103  soft-tissue]
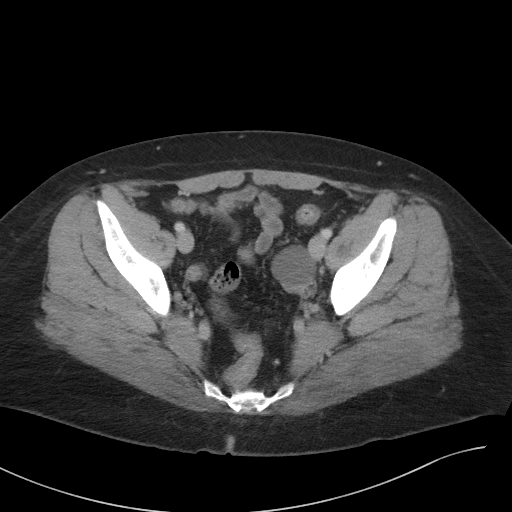
[im 36/103  soft-tissue]
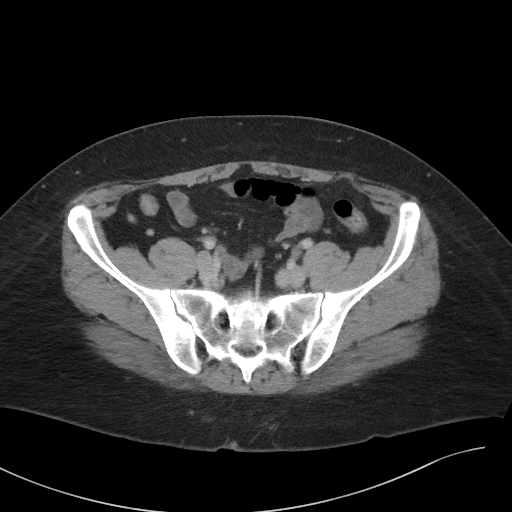
[im 40/103  soft-tissue]
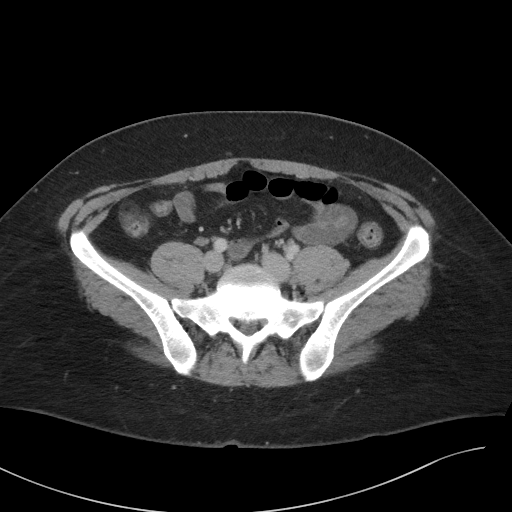
[im 49/103  soft-tissue]
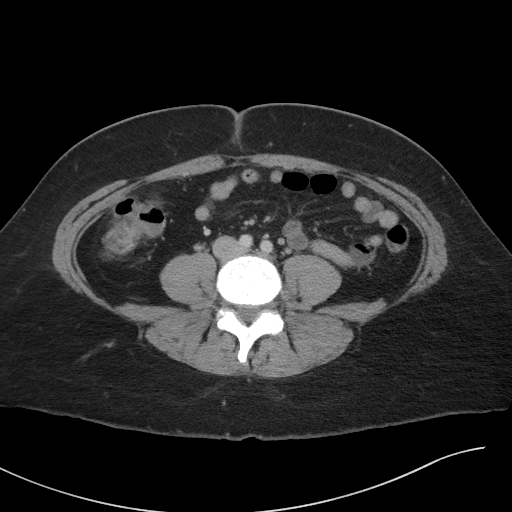
[im 54/103  soft-tissue]
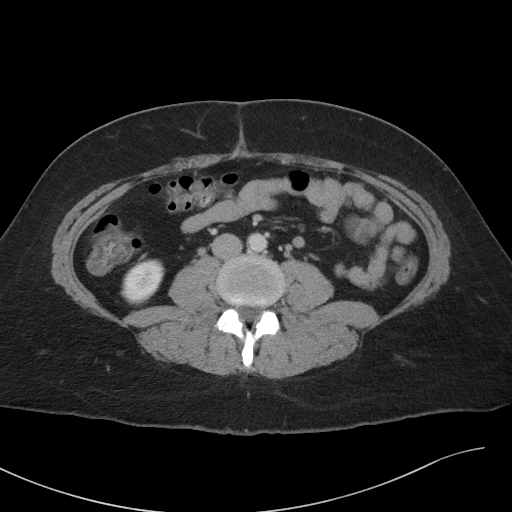
[im 63/103  soft-tissue]
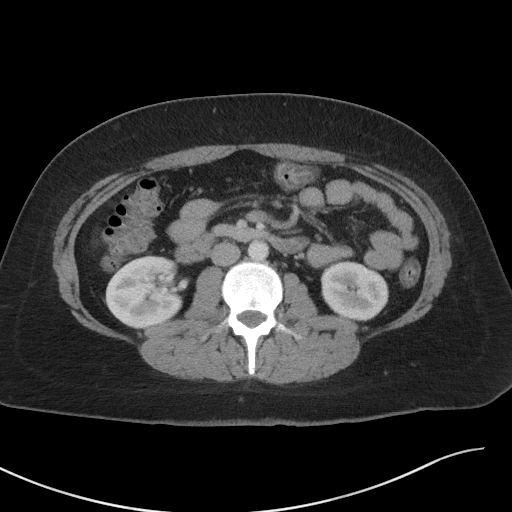
[im 63/103  bone]
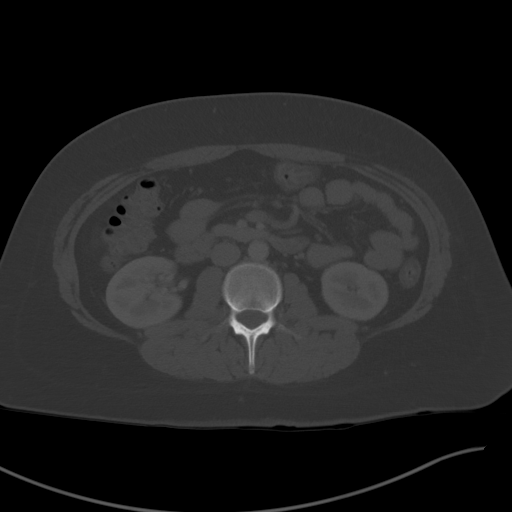
[im 67/103  soft-tissue]
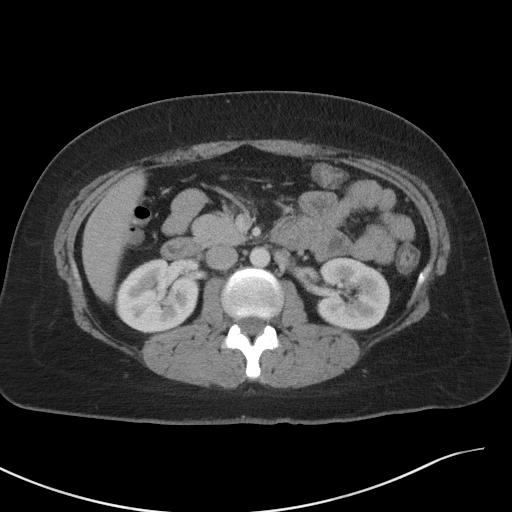
[im 76/103  soft-tissue]
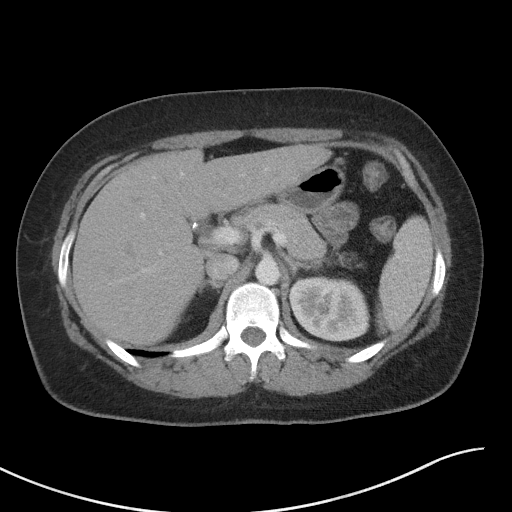
[im 85/103  soft-tissue]
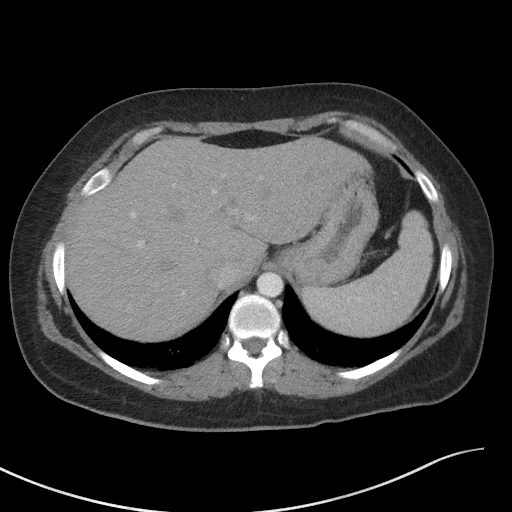
[im 89/103  soft-tissue]
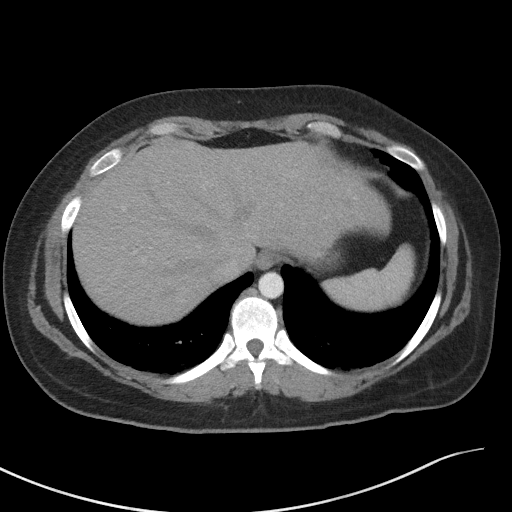
[im 98/103  soft-tissue]
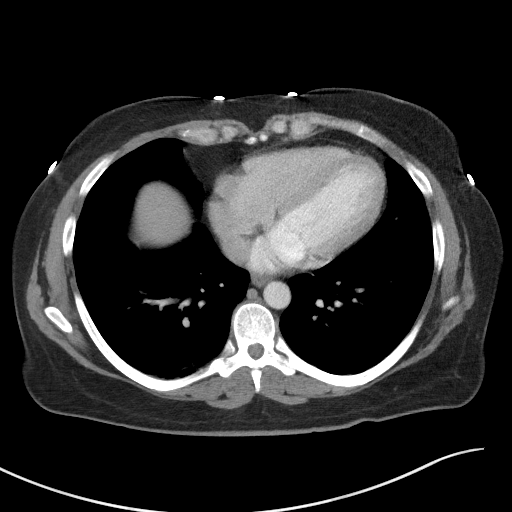

[Series 5: coronal st · coronal · 0.89mm/px · 3 of 85 slices shown]
[im 29/85  soft-tissue]
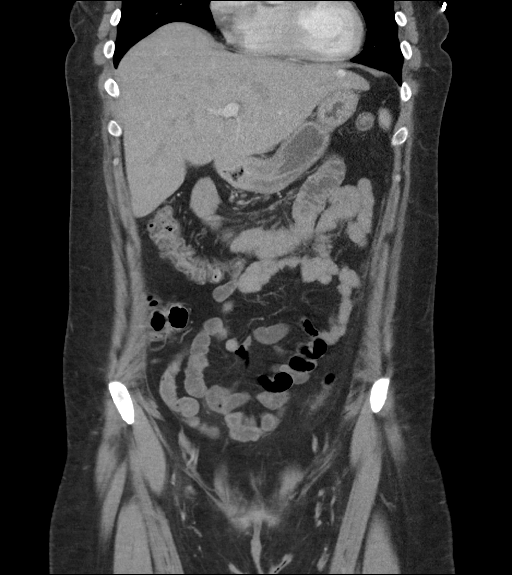
[im 38/85  soft-tissue]
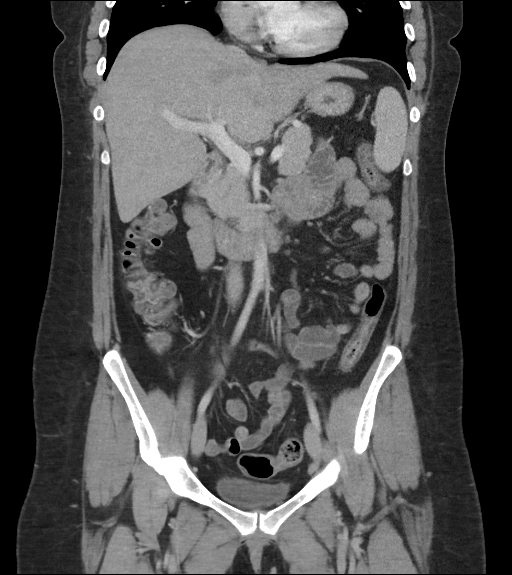
[im 47/85  soft-tissue]
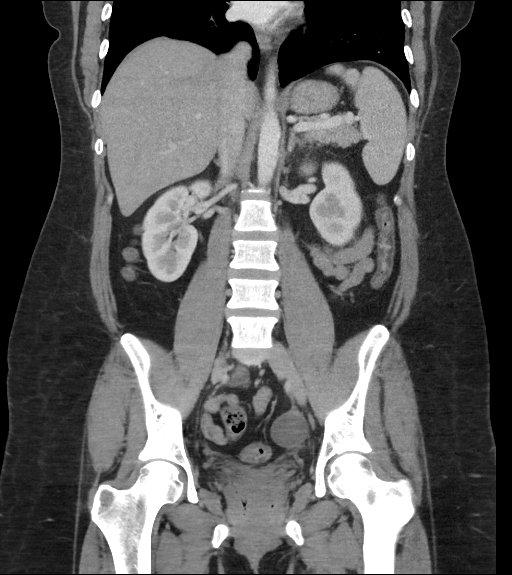

[17 of 46 positions shown; findings below may reference images not displayed]

FINDINGS: Lower chest: Dependent atelectasis. No effusions. Heart is
borderline in size.

Hepatobiliary: Prior cholecystectomy. Small enhancing area
peripherally in the right hepatic lobe measures 9 mm, likely benign
process such as hemangioma. No biliary ductal dilatation.

Pancreas: No focal abnormality or ductal dilatation.

Spleen: No focal abnormality.  Normal size.

Adrenals/Urinary Tract: 3 mm stone in the mid to lower pole of the
right kidney. No ureteral stones or hydronephrosis bilaterally.
Urinary bladder and adrenal glands unremarkable.

Stomach/Bowel: Normal appendix. Stomach, large and small bowel
grossly unremarkable.

Vascular/Lymphatic: No evidence of aneurysm or adenopathy.

Reproductive: Prior hysterectomy. 3.5 cm cystic area in the left
ovary, likely functional cyst.

Other: No free fluid or free air.

Musculoskeletal: No acute bony abnormality.
IMPRESSION: 3.5 cm left ovarian cyst, likely functional cyst.

No acute findings in the abdomen or pelvis.

## 2020-10-20 ENCOUNTER — Other Ambulatory Visit: Payer: Self-pay

## 2020-10-20 ENCOUNTER — Ambulatory Visit (HOSPITAL_COMMUNITY)
Admission: EM | Admit: 2020-10-20 | Discharge: 2020-10-20 | Disposition: A | Discharge status: Home / Self Care | Payer: Medicaid Other | Attending: Family Medicine | Admitting: Family Medicine

## 2020-10-20 ENCOUNTER — Encounter (HOSPITAL_COMMUNITY): Payer: Self-pay

## 2020-10-20 DIAGNOSIS — B029 Zoster without complications: Secondary | ICD-10-CM | POA: Diagnosis not present

## 2020-10-20 MED ORDER — VALACYCLOVIR HCL 1 G PO TABS: 1000.0000 mg | ORAL_TABLET | Freq: Three times a day (TID) | ORAL | 0 refills | Status: DC

## 2020-10-20 MED ORDER — CAPSAICIN 0.075 % EX CREA: 1.0000 "application " | TOPICAL_CREAM | Freq: Two times a day (BID) | CUTANEOUS | 0 refills | Status: DC | PRN

## 2020-10-20 NOTE — ED Triage Notes (Signed)
Pt c/o itchy, painful, "blister" rash to posterior left thigh for approx 4 days. Also reports significant muscle pain, decreased appetite.   Reports h/o chicken pox and staph skin infection to abdomen. Last dose ibuprofen 600mg  at 0900 and again at approx 1200. Education provided regarding safe dosages of ibuprofen.

## 2020-10-20 NOTE — ED Provider Notes (Signed)
  [place]   [id number] 10/20/20 Arrival Time: 1122  ASSESSMENT & PLAN:  1. Herpes zoster without complication     Meds ordered this encounter  Medications  . valACYclovir (VALTREX) 1000 MG tablet    Sig: Take 1 tablet (1,000 mg total) by mouth 3 (three) times daily.    Dispense:  21 tablet    Refill:  0  . capsicum (ZOSTRIX) 0.075 % topical cream    Sig: Apply 1 application topically 2 (two) times daily as needed.    Dispense:  28.3 g    Refill:  0    OTC analgesics as necessary.  Will follow up with PCP or here if worsening or failing to improve as anticipated. Reviewed expectations re: course of current medical issues. Questions answered. Outlined signs and symptoms indicating need for more acute intervention. Patient verbalized understanding. After Visit Summary given.   SUBJECTIVE:  Marilyn Lopez is a 35 y.o. female who presents with a skin complaint. Reports itchy, painful, "blister" rash to posterior left thigh for approx 4 days. Also reports significant muscle pain, decreased appetite. Afebrile. Ibuprofen with mild help. No extremity sensation changes or weakness.     OBJECTIVE: Vitals:   10/20/20 1210  BP: 127/85  Pulse: 83  Resp: 18  Temp: 97.9 F (36.6 C)  TempSrc: Oral  SpO2: 100%    General appearance: alert; no distress HEENT: [place]; AT Neck: supple with FROM Extremities: no edema; moves all extremities normally Skin: warm and dry; crops of red/purplish papules over upper lateral left thigh; no signs of infection Psychological: alert and cooperative; normal mood and affect  Allergies  Allergen Reactions  . Hydrocodone-Acetaminophen Nausea Only  . Oxycodone-Acetaminophen Nausea Only    Past Medical History:  Diagnosis Date  . Cervical cancer [facility])    Social History   Socioeconomic History  . Marital status: Single    Spouse name: Not on file  . Number of children: Not on file  . Years of education: Not on file  . Highest  education level: Not on file  Occupational History  . Not on file  Tobacco Use  . Smoking status: Current Every Day Smoker    Packs/day: 0.50  . Smokeless tobacco: Never Used  Vaping Use  . Vaping Use: Never used  Substance and Sexual Activity  . Alcohol use: No  . Drug use: No  . Sexual activity: Yes    Birth control/protection: None, Surgical  Other Topics Concern  . Not on file  Social History Narrative  . Not on file   Social Determinants of Health   Financial Resource Strain: Not on file  Food Insecurity: Not on file  Transportation Needs: Not on file  Physical Activity: Not on file  Stress: Not on file  Social Connections: Not on file  Intimate Partner Violence: Not on file   Family History  Problem Relation Age of Onset  . Heart disease Father   . Cancer Mother    Past Surgical History:  Procedure Laterality Date  . ABDOMINAL HYSTERECTOMY    . CHOLECYSTECTOMY    . Evonnie Dawes, MD 10/20/20 1226

## 2021-03-25 DIAGNOSIS — N83202 Unspecified ovarian cyst, left side: Secondary | ICD-10-CM | POA: Diagnosis not present

## 2021-03-25 DIAGNOSIS — R1032 Left lower quadrant pain: Secondary | ICD-10-CM | POA: Diagnosis not present

## 2021-03-25 DIAGNOSIS — A5903 Trichomonal cystitis and urethritis: Secondary | ICD-10-CM | POA: Diagnosis not present

## 2021-03-25 DIAGNOSIS — N390 Urinary tract infection, site not specified: Secondary | ICD-10-CM | POA: Diagnosis not present

## 2021-06-16 DIAGNOSIS — Z0389 Encounter for observation for other suspected diseases and conditions ruled out: Secondary | ICD-10-CM | POA: Diagnosis not present

## 2021-07-19 DIAGNOSIS — W010XXA Fall on same level from slipping, tripping and stumbling without subsequent striking against object, initial encounter: Secondary | ICD-10-CM | POA: Diagnosis not present

## 2021-07-19 DIAGNOSIS — S5001XA Contusion of right elbow, initial encounter: Secondary | ICD-10-CM | POA: Diagnosis not present

## 2021-08-10 ENCOUNTER — Other Ambulatory Visit (HOSPITAL_COMMUNITY): Payer: Self-pay

## 2021-08-10 ENCOUNTER — Telehealth: Payer: Self-pay

## 2021-08-10 NOTE — Telephone Encounter (Signed)
[facility] Patient Teacher, English as a foreign language completed.    The patient is insured through [facility] .  Medication will need a PA   We will continue to follow to see if copay assistance is needed.  Ileene Patrick, [facility] Patient [facility] for Infectious Disease Phone: [phone] Fax:  [phone]

## 2021-08-11 ENCOUNTER — Other Ambulatory Visit: Payer: Self-pay

## 2021-08-11 ENCOUNTER — Ambulatory Visit (INDEPENDENT_AMBULATORY_CARE_PROVIDER_SITE_OTHER): Payer: Medicaid Other | Admitting: Infectious Diseases

## 2021-08-11 VITALS — BP 124/81 | HR 94 | Temp 98.2°F | Wt 207.0 lb

## 2021-08-11 DIAGNOSIS — F17200 Nicotine dependence, unspecified, uncomplicated: Secondary | ICD-10-CM | POA: Diagnosis not present

## 2021-08-11 DIAGNOSIS — Z789 Other specified health status: Secondary | ICD-10-CM

## 2021-08-11 DIAGNOSIS — B182 Chronic viral hepatitis C: Secondary | ICD-10-CM

## 2021-08-11 DIAGNOSIS — F1490 Cocaine use, unspecified, uncomplicated: Secondary | ICD-10-CM | POA: Diagnosis not present

## 2021-08-11 NOTE — Progress Notes (Addendum)
[facility] for [facility]                                      [address], [place], [place], [address]                                               Phn. [phone]; Fax: [phone]                                                               Date: 08/11/21 Reason for Visit: Hepatitis C    HPI: Marilyn Lopez is a 35 y.o.old female with PMH of self reported anxiety/depression ( not on meds, follows therapist)cervical ca s/p hysterectomy and h/o substance use who is referred from HD for evaluation and management of hepatitis C.   She knew about positive Hep C status 3 months ago when she [facility]. Her ex husband was hep C positive and she was sexually active with him without protection ( vaginal and anal sex per HD notes). She also used to share oxycontin pills with him and snorted cocaine in the past. Denies using IVDU ever. Says she has been clean from illicit drugs for 12 years now. Says she has been tested multiple times for HCV while donating blood and was never told about her hep C and she is surprised she has positive Hep C 3 months ago. Denies receiving treatment for Hep C. Denies family h/o liver diseases or liver ca.  Smokes 1/2 pack of cigarettes daily, drinks alcohol occasionally, reports being clean from illicit drugs for last 12 years. Lives with her mother and 5 children.   Denies any hospitalizations related to liver disease, jaundice, ascites, GI bleeding, mental status changes, abdominal pain and acholic stool.   Always feels nauseous, alternates between loose stool and regular BM. No changes in appetite and weight.   ROS: Denies fever, chills, nightsweats, vomiting, constipation, weight loss, rashes, joint complaints, shortness of breath, chest pain, headaches, dysuria .  Current Outpatient Medications on File Prior to Visit  Medication Sig Dispense Refill   ibuprofen (ADVIL) 800 MG  tablet Take by mouth.     No current facility-administered medications on file prior to visit.    Allergies  Allergen Reactions   Hydrocodone-Acetaminophen Nausea Only   Oxycodone-Acetaminophen Nausea Only   Past Medical History:  Diagnosis Date   Cervical cancer ([place])    Past Surgical History:  Procedure Laterality Date   ABDOMINAL HYSTERECTOMY     CHOLECYSTECTOMY     TONSILLECTOMY     Social History   Socioeconomic History   Marital status: Single    Spouse name: Not on file   Number of children: Not on file   Years of education: Not on file   Highest education level: Not on file  Occupational History   Not on file  Tobacco Use   Smoking status: Every Day    Packs/day: 0.50    Types: Cigarettes   Smokeless tobacco: Never  Vaping Use   Vaping Use: Never used  Substance and Sexual Activity   Alcohol use: No   Drug use: No   Sexual activity: Yes    Birth control/protection: None, Surgical  Other Topics Concern   Not on file  Social History Narrative   Not on file   Social Determinants of Health   Financial Resource Strain: Not on file  Food Insecurity: Not on file  Transportation Needs: Not on file  Physical Activity: Not on file  Stress: Not on file  Social Connections: Not on file  Intimate Partner Violence: Not on file   Family History  Problem Relation Age of Onset   Heart disease Father    Cancer Mother     Vitals  BP 124/81    Pulse 94    Temp 98.2 F (36.8 C) (Oral)    Wt 207 lb (93.9 kg)    SpO2 99%    BMI 33.41 kg/m   Gen: Alert and oriented x 3, no acute distress HEENT: [place]/AT, no scleral icterus, no pale conjunctivae, hearing normal, oral mucosa moist Neck: Supple, no lymphadenopathy Cardio: Regular rate and rhythm; +S1 and S2; no murmurs Resp: CTAB GI: Soft, nontender, nondistended, bowel sounds present GU: Musc: Extremities: No pedal edema Skin: No rashes, lesions Neuro: grossly non focal  Psych: Calm,  cooperative   Laboratory  06/16/21 HCV RNA detected   Problem List Items Addressed This Visit       Digestive   Chronic hepatitis C without hepatic coma ([place]) - Primary   Relevant Orders   Hepatitis C RNA quantitative     Other   Smoking   Alcohol use   Cocaine use    Assessment/Plan: Chronic Hepatitis C Will get HCV RNA first to get VL. If positive, will get additional labs for HCV treatment planning  FU in a week   Smoking/Alcohol use Counseled   Counseling done on the following -Natural progression of hep c, transmission (avoid sharing personal hygiene equipment), prevention, risks of left untreated and treatment options  -Avoid hepatotoxins like alcohol and excessive acetamaminphen (no more than 2 gram a day) -Avoid eating raw sea food -Risks of re-infection  -Hepatitis coinfection and vaccination   I have personally spent 70  minutes involved in face-to-face and non-face-to-face activities for this patient on the day of the visit. Professional time spent includes the following activities: Preparing to see the patient (review of tests), Obtaining and/or reviewing separately obtained history (admission/discharge record), Performing a medically appropriate examination and/or evaluation , Ordering medications/tests/procedures, referring and communicating with other health care professionals, Documenting clinical information in the EMR, Independently interpreting results (not separately reported), Communicating results to the patient/family/caregiver, Counseling and educating the patient/family/caregiver and Care coordination (not separately reported).   Patients questions were addressed and answered.   Electronically signed by:  Rosiland Oz, MD Infectious Diseases  Office phone [phone] Fax no. [phone]

## 2021-08-15 LAB — HEPATITIS C RNA QUANTITATIVE
HCV Quantitative Log: 6.26 log IU/mL — ABNORMAL HIGH
HCV RNA, PCR, QN: 1800000 IU/mL — ABNORMAL HIGH

## 2021-08-24 ENCOUNTER — Ambulatory Visit (INDEPENDENT_AMBULATORY_CARE_PROVIDER_SITE_OTHER): Payer: Medicaid Other | Admitting: Infectious Diseases

## 2021-08-24 ENCOUNTER — Other Ambulatory Visit: Payer: Self-pay

## 2021-08-24 VITALS — BP 138/80 | HR 92 | Resp 16 | Ht 66.0 in | Wt 206.0 lb

## 2021-08-24 DIAGNOSIS — F1490 Cocaine use, unspecified, uncomplicated: Secondary | ICD-10-CM | POA: Diagnosis not present

## 2021-08-24 DIAGNOSIS — B182 Chronic viral hepatitis C: Secondary | ICD-10-CM

## 2021-08-24 DIAGNOSIS — F17200 Nicotine dependence, unspecified, uncomplicated: Secondary | ICD-10-CM | POA: Diagnosis not present

## 2021-08-24 DIAGNOSIS — Z23 Encounter for immunization: Secondary | ICD-10-CM | POA: Diagnosis not present

## 2021-08-24 NOTE — Progress Notes (Signed)
[facility] for Infectious [facility]                                      [address], [place], [place], [address]                                               Phn. [phone]; Fax: [phone]                                                               Date: 08/24/21 Reason for Follow Up: Hepatitis   Subjective  Here for fu for need for further labs for Hepatitis C for treatment planning. She is tearful about hepatitis C diagnosis and consequences of prior IVDU. Counseled and provided emotional support. No complaints otherwise.   ROS done - negative excerpt as above   Current Outpatient Medications on File Prior to Visit  Medication Sig Dispense Refill   ibuprofen (ADVIL) 800 MG tablet Take by mouth.     No current facility-administered medications on file prior to visit.    Allergies  Allergen Reactions   Hydrocodone-Acetaminophen Nausea Only   Oxycodone-Acetaminophen Nausea Only   Past Medical History:  Diagnosis Date   Cervical cancer ([place])    Past Surgical History:  Procedure Laterality Date   ABDOMINAL HYSTERECTOMY     CHOLECYSTECTOMY     TONSILLECTOMY     Social History   Socioeconomic History   Marital status: Single    Spouse name: Not on file   Number of children: Not on file   Years of education: Not on file   Highest education level: Not on file  Occupational History   Not on file  Tobacco Use   Smoking status: Every Day    Packs/day: 0.50    Types: Cigarettes   Smokeless tobacco: Never  Vaping Use   Vaping Use: Never used  Substance and Sexual Activity   Alcohol use: No   Drug use: No   Sexual activity: Yes    Birth control/protection: None, Surgical  Other Topics Concern   Not on file  Social History Narrative   Not on file   Social Determinants of Health   Financial Resource Strain: Not on file  Food Insecurity: Not on file  Transportation Needs: Not on file  Physical Activity:  Not on file  Stress: Not on file  Social Connections: Not on file  Intimate Partner Violence: Not on file   Family History  Problem Relation Age of Onset   Heart disease Father    Cancer Mother     Vitals  BP 138/80    Pulse 92    Resp 16    Ht 5\' 6"  (1.676 m)    Wt 206 lb (93.4 kg)    SpO2 99%    BMI 33.25 kg/m   Sitting in the chair HEENT - WNL Awake, alert and oriented  Ambulatory  Laboratory  06/16/21 HCV RNA detected   Hepatitis C RNA quantitative Latest Ref Rng & Units 08/11/2021  HCV Quantitative Log log IU/mL 6.26(H)  Problem List Items Addressed This Visit       Digestive   Chronic hepatitis C without hepatic coma ([place]) - Primary   Relevant Orders   CBC   Hepatic function panel   Hepatitis C genotype   Liver Fibrosis, FibroTest-ActiTest   Protime-INR   HIV Antibody (routine testing w rflx)   Hepatitis A antibody, total   Hepatitis B core antibody, total   Hepatitis B surface antibody,qualitative   Hepatitis B surface antigen   US ABDOMEN COMPLETE W/ELASTOGRAPHY    Assessment/Plan: Chronic Hepatitis C In the setting of IVDU Will get additional labs for HCV tx planning as below Fu in 4 weeks  Orders Placed This Encounter  Procedures   US Abdomen Complete   CBC   Hepatic function panel   Hepatitis C genotype   Liver Fibrosis, FibroTest-ActiTest   Protime-INR   HIV Antibody (routine testing w rflx)   Hepatitis A antibody, total   Hepatitis B core antibody, total   Hepatitis B surface antibody,qualitative   Hepatitis B surface antigen   Smoking Counseled   Need for Hep A and B immunization  Check for Hepatitis A and B serology   I have personally spent 30 minutes involved in face-to-face and non-face-to-face activities for this patient on the day of the visit.   Patients questions were addressed and answered.   Electronically signed by:  Rosiland Oz, MD Infectious Diseases  Office phone [phone] Fax no. [phone]

## 2021-08-31 LAB — LIVER FIBROSIS, FIBROTEST-ACTITEST
ALT: 17 U/L (ref 6–29)
Alpha-2-Macroglobulin: 272 mg/dL (ref 106–279)
Apolipoprotein A1: 149 mg/dL (ref 101–198)
Bilirubin: 0.3 mg/dL (ref 0.2–1.2)
Fibrosis Score: 0.1
GGT: 15 U/L (ref 3–50)
Haptoglobin: 172 mg/dL (ref 43–212)
Necroinflammat ACT Score: 0.05
Reference ID: 4188916

## 2021-08-31 LAB — HEPATIC FUNCTION PANEL
AG Ratio: 1.6 (calc) (ref 1.0–2.5)
ALT: 17 U/L (ref 6–29)
AST: 16 U/L (ref 10–30)
Albumin: 4.4 g/dL (ref 3.6–5.1)
Alkaline phosphatase (APISO): 75 U/L (ref 31–125)
Bilirubin, Direct: 0.1 mg/dL (ref 0.0–0.2)
Globulin: 2.7 g/dL (calc) (ref 1.9–3.7)
Indirect Bilirubin: 0.2 mg/dL (calc) (ref 0.2–1.2)
Total Bilirubin: 0.3 mg/dL (ref 0.2–1.2)
Total Protein: 7.1 g/dL (ref 6.1–8.1)

## 2021-08-31 LAB — CBC
HCT: 44.1 % (ref 35.0–45.0)
Hemoglobin: 15 g/dL (ref 11.7–15.5)
MCH: 31.6 pg (ref 27.0–33.0)
MCHC: 34 g/dL (ref 32.0–36.0)
MCV: 93 fL (ref 80.0–100.0)
MPV: 9.7 fL (ref 7.5–12.5)
Platelets: 240 10*3/uL (ref 140–400)
RBC: 4.74 10*6/uL (ref 3.80–5.10)
RDW: 12.5 % (ref 11.0–15.0)
WBC: 10.8 10*3/uL (ref 3.8–10.8)

## 2021-08-31 LAB — HIV ANTIBODY (ROUTINE TESTING W REFLEX): HIV 1&2 Ab, 4th Generation: NONREACTIVE

## 2021-08-31 LAB — HEPATITIS B SURFACE ANTIGEN: Hepatitis B Surface Ag: NONREACTIVE

## 2021-08-31 LAB — PROTIME-INR
INR: 1
Prothrombin Time: 9.8 s (ref 9.0–11.5)

## 2021-08-31 LAB — HEPATITIS B SURFACE ANTIBODY,QUALITATIVE: Hep B S Ab: NONREACTIVE

## 2021-08-31 LAB — HEPATITIS C GENOTYPE

## 2021-08-31 LAB — HEPATITIS B CORE ANTIBODY, TOTAL: Hep B Core Total Ab: NONREACTIVE

## 2021-08-31 LAB — HEPATITIS A ANTIBODY, TOTAL: Hepatitis A AB,Total: NONREACTIVE

## 2021-09-20 ENCOUNTER — Other Ambulatory Visit: Payer: Self-pay

## 2021-09-20 ENCOUNTER — Ambulatory Visit (HOSPITAL_COMMUNITY)
Admission: RE | Admit: 2021-09-20 | Discharge: 2021-09-20 | Disposition: A | Discharge status: Home / Self Care | Payer: Medicaid Other | Source: Ambulatory Visit | Attending: Infectious Diseases | Admitting: Infectious Diseases

## 2021-09-20 DIAGNOSIS — B182 Chronic viral hepatitis C: Secondary | ICD-10-CM | POA: Diagnosis not present

## 2021-09-21 ENCOUNTER — Telehealth: Payer: Self-pay

## 2021-09-21 ENCOUNTER — Other Ambulatory Visit: Payer: Self-pay | Admitting: Pharmacist

## 2021-09-21 ENCOUNTER — Other Ambulatory Visit (HOSPITAL_COMMUNITY): Payer: Self-pay

## 2021-09-21 DIAGNOSIS — B182 Chronic viral hepatitis C: Secondary | ICD-10-CM

## 2021-09-21 MED ORDER — MAVYRET 100-40 MG PO TABS
3.0000 | ORAL_TABLET | Freq: Every day | ORAL | 1 refills | Status: AC
  Filled 2021-09-21: qty 84, fill #0
  Filled 2021-09-21: qty 84, 28d supply, fill #0
  Filled 2021-10-18 – 2021-10-19 (×2): qty 84, 28d supply, fill #1

## 2021-09-21 NOTE — Telephone Encounter (Signed)
Script sent to [facility]. Will counsel patient at her next office visit on 2/15. Thank you Butch Penny!

## 2021-09-21 NOTE — Telephone Encounter (Signed)
[facility] Patient Advocate Encounter   Received notification from [facility] [place] that prior authorization for Mavyret is required.   PA submitted on 09/21/21 Key [id number] Status is pending    [facility] will continue to follow.   Ileene Patrick, [facility] Patient [facility] for Infectious Disease Phone: [phone] Fax:  [phone]

## 2021-09-21 NOTE — Telephone Encounter (Signed)
[facility] Patient Advocate Encounter  Prior Authorization for Mavyret  has been approved.    PA# [id number] Effective dates: 09/21/21 through 11/16/21  Patients co-pay is $4.00.   Prescription can be sent to [facility] .  I will have the medication shipped to the clinic for patient next office visit on 09/28/21.  [facility] will continue to follow.  Ileene Patrick, [facility] Patient [facility] for Infectious Disease Phone: [phone] Fax:  [phone]

## 2021-09-22 ENCOUNTER — Telehealth: Payer: Self-pay

## 2021-09-22 NOTE — Telephone Encounter (Signed)
[facility] Patient Advocate Encounter  Patient's medications have been couriered to [facility] from [place] and will be picked up 09/28/21.  Marilyn Lopez , [facility] Patient [facility] for Infectious Disease Phone: [phone] Fax:  [phone]

## 2021-09-28 ENCOUNTER — Other Ambulatory Visit: Payer: Self-pay

## 2021-09-28 ENCOUNTER — Encounter: Payer: Self-pay | Admitting: Infectious Diseases

## 2021-09-28 ENCOUNTER — Ambulatory Visit (INDEPENDENT_AMBULATORY_CARE_PROVIDER_SITE_OTHER): Payer: Medicaid Other | Admitting: Pharmacist

## 2021-09-28 ENCOUNTER — Ambulatory Visit: Payer: Medicaid Other | Admitting: Infectious Diseases

## 2021-09-28 VITALS — BP 134/84 | HR 102 | Temp 98.0°F | Resp 16 | Ht 66.0 in | Wt 209.0 lb

## 2021-09-28 DIAGNOSIS — B182 Chronic viral hepatitis C: Secondary | ICD-10-CM

## 2021-09-28 DIAGNOSIS — Z23 Encounter for immunization: Secondary | ICD-10-CM

## 2021-09-28 NOTE — Patient Instructions (Signed)
Alfonse Spruce, Pharmacist Phone #: [phone]

## 2021-09-28 NOTE — Progress Notes (Signed)
HPI: Marilyn Lopez is a 35 y.o. female who presents to the [place] clinic for Hepatitis C follow-up.  Medication: Mavyret x 8 weeks  Start Date: 09/28/21  Hepatitis C Genotype: 1a  Fibrosis Score: F0  Hepatitis C RNA: 1.8 million (1/23)  Patient Active Problem List   Diagnosis Date Noted   Need for viral immunization 08/24/2021   Chronic hepatitis C without hepatic coma ([place]) 08/11/2021   Smoking 08/11/2021   Alcohol use 08/11/2021   Cocaine use 08/11/2021   Ovarian cyst, left 09/04/2018   S/P abdominal hysterectomy and right salpingo-oophorectomy 09/04/2018    Patient's Medications  New Prescriptions   No medications on file  Previous Medications   GLECAPREVIR-PIBRENTASVIR (MAVYRET) 100-40 MG TABS    Take 3 tablets by mouth daily with breakfast.   IBUPROFEN (ADVIL) 800 MG TABLET    Take by mouth.  Modified Medications   No medications on file  Discontinued Medications   No medications on file    Allergies: Allergies  Allergen Reactions   Hydrocodone-Acetaminophen Nausea Only   Oxycodone-Acetaminophen Nausea Only    Past Medical History: Past Medical History:  Diagnosis Date   Cervical cancer ([place])     Social History: Social History   Socioeconomic History   Marital status: Single    Spouse name: Not on file   Number of children: Not on file   Years of education: Not on file   Highest education level: Not on file  Occupational History   Not on file  Tobacco Use   Smoking status: Every Day    Packs/day: 0.50    Types: Cigarettes   Smokeless tobacco: Never  Vaping Use   Vaping Use: Never used  Substance and Sexual Activity   Alcohol use: No   Drug use: No   Sexual activity: Yes    Birth control/protection: None, Surgical  Other Topics Concern   Not on file  Social History Narrative   Not on file   Social Determinants of Health   Financial Resource Strain: Not on file  Food Insecurity: Not on file  Transportation Needs: Not on  file  Physical Activity: Not on file  Stress: Not on file  Social Connections: Not on file    Labs: Hepatitis C Lab Results  Component Value Date   HCVGENOTYPE 1a 08/24/2021   HCVRNAPCRQN 1,800,000 (H) 08/11/2021   FIBROSTAGE F0 08/24/2021   Hepatitis B Lab Results  Component Value Date   HEPBSAB NON-REACTIVE 08/24/2021   HEPBSAG NON-REACTIVE 08/24/2021   HEPBCAB NON-REACTIVE 08/24/2021   Hepatitis A Lab Results  Component Value Date   HAV NON-REACTIVE 08/24/2021   HIV Lab Results  Component Value Date   HIV NON-REACTIVE 08/24/2021   Lab Results  Component Value Date   CREATININE 0.62 08/19/2018   CREATININE 0.59 06/09/2007   Lab Results  Component Value Date   AST 16 08/24/2021   AST 22 08/19/2018   ALT 17 08/24/2021   ALT 17 08/24/2021   ALT 32 08/19/2018   INR 1.0 08/24/2021    Assessment: Marilyn Lopez presents to clinic today for HCV follow-up. Will start [place] around dinnertime. She was concerned about side effects she had heard about from family members such as losing hair. She was also very concerned about transmitting HCV to her husband and 5 children. Reassured her that Marilyn Lopez is well tolerated and that the risk of HCV transmission via sexual fluid or saliva is extremely low. She takes an OTC herbal supplement for leg  cramps that contains acontium napellus, cinchona officinalis, gnaphalium polycephalum, ledum palustre, magnesia phosphorica, rhus toxicondendron, and viscum album. Unable to find data assessing DDI between these and Mavyret but will monitor her HCV RNA at next follow-up in 1 month. Provided her my number and told her to call with any issues or concerns.  Patient is approved to receive Mavyret x 8 weeks for chronic Hepatitis C infection. Counseled patient to take all three tablets of Mavyret daily with food.  Counseled patient the need to take all three tablets together and to not separate them out during the day. Encouraged patient not to  miss any doses and explained how their chance of cure could go down with each dose missed. Counseled patient on what to do if dose is missed - if it is closer to the missed dose take immediately; if closer to next dose then skip dose and take the next dose at the usual time. Counseled patient on common side effects such as headache, fatigue, and nausea and that these normally decrease with time.    Plan: Start Mavyret Counseled on [place] Follow-up with me on 3/15 at 9:15am  Alfonse Spruce, PharmD, CPP Clinical Pharmacist Practitioner [place] for Infectious Disease 09/28/2021, 4:09 PM

## 2021-09-28 NOTE — Progress Notes (Signed)
[facility]                                      [address], [place], [place], [address]                                               Phn. [phone]; Fax: [phone]                                                               Date: 09/28/21 Reason for Follow Up: Hepatitis C  Subjective  Here for fu for need for Hepatitis C. She has not started taking Mavyret yet. Complains of fatigue and brain fog. She also complains of itching in her legs for which she has been taking pills OTC for leg cramps. She does not know the name.  ( discussed to hold if not necessary while being treated for HCV). No other medications. Discussed US abdomen findings. Pharmacy will meet with her and provide her with supply of Mavyret for 8 weeks.   ROS done - negative excerpt as above   Current Outpatient Medications on File Prior to Visit  Medication Sig Dispense Refill   Glecaprevir-Pibrentasvir (MAVYRET) 100-40 MG TABS Take 3 tablets by mouth daily with breakfast. 84 tablet 1   ibuprofen (ADVIL) 800 MG tablet Take by mouth.     No current facility-administered medications on file prior to visit.    Allergies  Allergen Reactions   Hydrocodone-Acetaminophen Nausea Only   Oxycodone-Acetaminophen Nausea Only   Past Medical History:  Diagnosis Date   Cervical cancer ([place])    Past Surgical History:  Procedure Laterality Date   ABDOMINAL HYSTERECTOMY     CHOLECYSTECTOMY     TONSILLECTOMY     Social History   Socioeconomic History   Marital status: Single    Spouse name: Not on file   Number of children: Not on file   Years of education: Not on file   Highest education level: Not on file  Occupational History   Not on file  Tobacco Use   Smoking status: Every Day    Packs/day: 0.50    Types: Cigarettes   Smokeless tobacco: Never  Vaping Use   Vaping Use: Never used  Substance and Sexual Activity   Alcohol use: No    Drug use: No   Sexual activity: Yes    Birth control/protection: None, Surgical  Other Topics Concern   Not on file  Social History Narrative   Not on file   Social Determinants of Health   Financial Resource Strain: Not on file  Food Insecurity: Not on file  Transportation Needs: Not on file  Physical Activity: Not on file  Stress: Not on file  Social Connections: Not on file  Intimate Partner Violence: Not on file   Family History  Problem Relation Age of Onset   Heart disease Father    Cancer Mother     Vitals  BP 134/84    Pulse (!) 102    Temp 98 F (36.7 C)  Resp 16    Ht 5\' 6"  (1.676 m)    Wt 209 lb (94.8 kg)    SpO2 98%    BMI 33.73 kg/m    Sitting in the chair HEENT - WNL Awake, alert and oriented  Grossly non focal neuro exam Heart and lung sounds WNL  Laboratory  06/16/21 HCV RNA detected   Hepatitis C RNA quantitative Latest Ref Rng & Units 08/11/2021  HCV Quantitative Log log IU/mL 6.26(H)   Imaging US abdomen 09/20/2021 IMPRESSION: Status post cholecystectomy. Otherwise negative examination. The liver appears within normal limits.  Problem List Items Addressed This Visit       Digestive   Chronic hepatitis C without hepatic coma ([place]) - Primary     Other   Need for viral immunization    Assessment/Plan: # Chronic Hepatitis C Mavyret for 8 weeks  Fu in a month for HCV RNA with pharmacy   # Need for Hep A and B immunization  Hep A # 1 Hep B # 1  I have personally spent 30 minutes involved in face-to-face and non-face-to-face activities for this patient on the day of the visit.   Patients questions were addressed and answered.   Electronically signed by:  Rosiland Oz, MD Infectious Diseases  Office phone [phone] Fax no. [phone]

## 2021-10-18 ENCOUNTER — Other Ambulatory Visit (HOSPITAL_COMMUNITY): Payer: Self-pay

## 2021-10-19 ENCOUNTER — Other Ambulatory Visit: Payer: Self-pay

## 2021-10-19 ENCOUNTER — Other Ambulatory Visit (HOSPITAL_COMMUNITY): Payer: Self-pay

## 2021-10-26 ENCOUNTER — Other Ambulatory Visit (HOSPITAL_COMMUNITY): Payer: Self-pay

## 2021-10-26 ENCOUNTER — Other Ambulatory Visit: Payer: Self-pay

## 2021-10-26 ENCOUNTER — Ambulatory Visit (INDEPENDENT_AMBULATORY_CARE_PROVIDER_SITE_OTHER): Payer: Medicaid Other | Admitting: Pharmacist

## 2021-10-26 DIAGNOSIS — B182 Chronic viral hepatitis C: Secondary | ICD-10-CM

## 2021-10-26 DIAGNOSIS — Z23 Encounter for immunization: Secondary | ICD-10-CM

## 2021-10-26 NOTE — Progress Notes (Signed)
? ?10/26/2021 ? ?HPI: Marilyn Lopez is a 36 y.o. female who presents to the [place] clinic for Hepatitis C follow-up. ? ?Medication: Mavyret x 8 weeks ?  ?Start Date: 09/28/21 ?  ?Hepatitis C Genotype: 1a ?  ?Fibrosis Score: F0 ?  ?Hepatitis C RNA: 1.8 million (1/23) ? ?Patient Active Problem List  ? Diagnosis Date Noted  ? Need for viral immunization 08/24/2021  ? Chronic hepatitis C without hepatic coma ([place]) 08/11/2021  ? Smoking 08/11/2021  ? Alcohol use 08/11/2021  ? Cocaine use 08/11/2021  ? Ovarian cyst, left 09/04/2018  ? S/P abdominal hysterectomy and right salpingo-oophorectomy 09/04/2018  ? ? ?Patient's Medications  ?New Prescriptions  ? No medications on file  ?Previous Medications  ? GLECAPREVIR-PIBRENTASVIR (MAVYRET) 100-40 MG TABS    Take 3 tablets by mouth daily with breakfast.  ? IBUPROFEN (ADVIL) 800 MG TABLET    Take by mouth.  ?Modified Medications  ? No medications on file  ?Discontinued Medications  ? No medications on file  ? ? ?Allergies: ?Allergies  ?Allergen Reactions  ? Hydrocodone-Acetaminophen Nausea Only  ? Oxycodone-Acetaminophen Nausea Only  ? ? ?Past Medical History: ?Past Medical History:  ?Diagnosis Date  ? Cervical cancer ([place])   ? ? ?Social History: ?Social History  ? ?Socioeconomic History  ? Marital status: Single  ?  Spouse name: Not on file  ? Number of children: Not on file  ? Years of education: Not on file  ? Highest education level: Not on file  ?Occupational History  ? Not on file  ?Tobacco Use  ? Smoking status: Every Day  ?  Packs/day: 0.50  ?  Types: Cigarettes  ? Smokeless tobacco: Never  ?Vaping Use  ? Vaping Use: Never used  ?Substance and Sexual Activity  ? Alcohol use: No  ? Drug use: No  ? Sexual activity: Yes  ?  Birth control/protection: None, Surgical  ?Other Topics Concern  ? Not on file  ?Social History Narrative  ? Not on file  ? ?Social Determinants of Health  ? ?Financial Resource Strain: Not on file  ?Food Insecurity: Not on file   ?Transportation Needs: Not on file  ?Physical Activity: Not on file  ?Stress: Not on file  ?Social Connections: Not on file  ? ? ?Labs: ?Hepatitis C ?Lab Results  ?Component Value Date  ? HCVGENOTYPE 1a 08/24/2021  ? UTMLYYTKPTW 1,800,000 (H) 08/11/2021  ? FIBROSTAGE F0 08/24/2021  ? ?Hepatitis B ?Lab Results  ?Component Value Date  ? HEPBSAB NON-REACTIVE 08/24/2021  ? HEPBSAG NON-REACTIVE 08/24/2021  ? HEPBCAB NON-REACTIVE 08/24/2021  ? ?Hepatitis A ?Lab Results  ?Component Value Date  ? HAV NON-REACTIVE 08/24/2021  ? ?HIV ?Lab Results  ?Component Value Date  ? HIV NON-REACTIVE 08/24/2021  ? ?Lab Results  ?Component Value Date  ? CREATININE 0.62 08/19/2018  ? CREATININE 0.59 06/09/2007  ? ?Lab Results  ?Component Value Date  ? AST 16 08/24/2021  ? AST 22 08/19/2018  ? ALT 17 08/24/2021  ? ALT 17 08/24/2021  ? ALT 32 08/19/2018  ? INR 1.0 08/24/2021  ? ? ?Assessment: ?Marilyn Lopez states that she has been doing well on [place]. She continues to take all 3 capsules with food at bed time. She has noticed she has been more fatigued and nauseas since starting [place] but has not missed any doses. She received her second bottle from [facility] via mail order. Will give second HepB vaccine today, due for second HepA due in 5 months.   ?  ?  Plan: ?- Continue Mavyret ?- HepB vaccine  ?- Follow up with Dr. [place] Bali on 11/22/21 at 8:45 ? ?Joseph Art, Pharm.D. ?PGY-1 Pharmacy Resident ?10/26/2021 10:51 AM ?

## 2021-10-30 LAB — HEPATITIS C RNA QUANTITATIVE
HCV Quantitative Log: <1.18 log IU/mL
HCV RNA, PCR, QN: <15 IU/mL

## 2021-11-11 ENCOUNTER — Other Ambulatory Visit (HOSPITAL_COMMUNITY): Payer: Self-pay

## 2021-11-22 ENCOUNTER — Telehealth: Payer: Self-pay

## 2021-11-22 ENCOUNTER — Ambulatory Visit: Payer: Medicaid Other | Admitting: Infectious Diseases

## 2021-11-22 NOTE — Telephone Encounter (Signed)
Called patient to see if she would be able to make it to today's appointment. No answer and no voicemail box set up. ? ?Beryle Flock, RN ? ?

## 2022-04-16 DIAGNOSIS — Z043 Encounter for examination and observation following other accident: Secondary | ICD-10-CM | POA: Diagnosis not present

## 2022-04-16 DIAGNOSIS — M25511 Pain in right shoulder: Secondary | ICD-10-CM | POA: Diagnosis not present

## 2022-05-10 ENCOUNTER — Other Ambulatory Visit (HOSPITAL_COMMUNITY): Payer: Self-pay

## 2022-08-25 ENCOUNTER — Other Ambulatory Visit (HOSPITAL_COMMUNITY): Payer: Self-pay

## 2023-09-16 IMAGING — US US ABDOMEN COMPLETE
1 series · 14 of 25 positions shown · non-contrast
Comparison: CT 08/19/2018

CLINICAL DATA: Hep C

EXAM:
ABDOMEN ULTRASOUND COMPLETE

[Series 1: us abdomen complete · 14 of 103 slices shown]
[im 1/103]
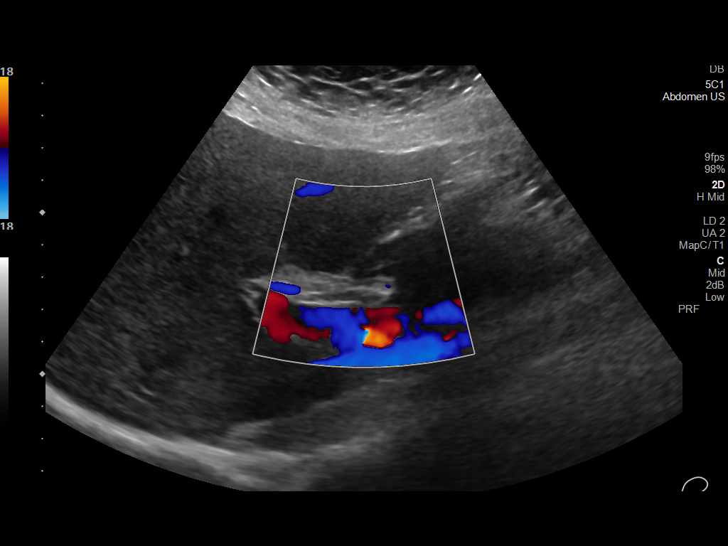
[im 9/103]
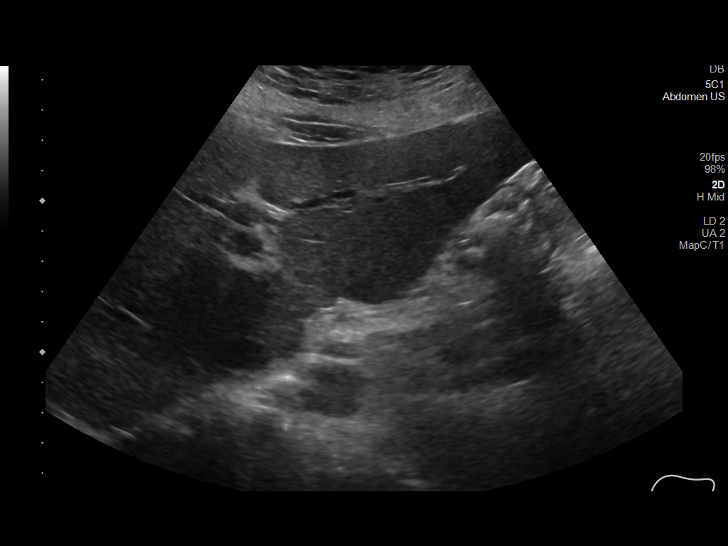
[im 18/103]
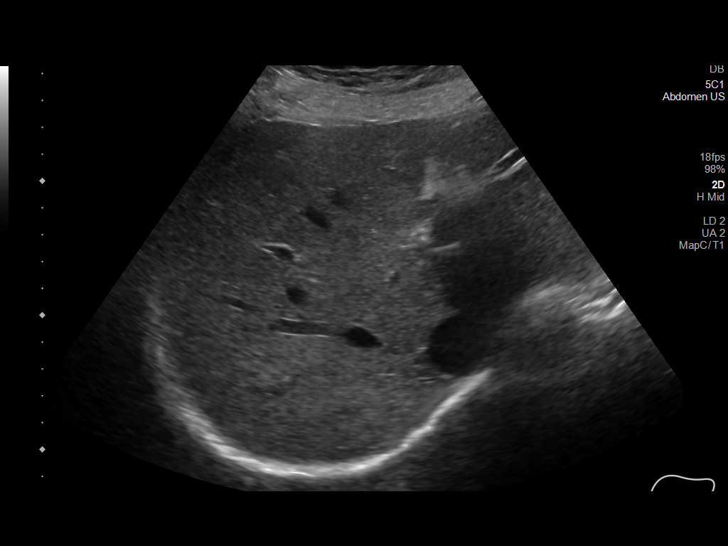
[im 26/103]
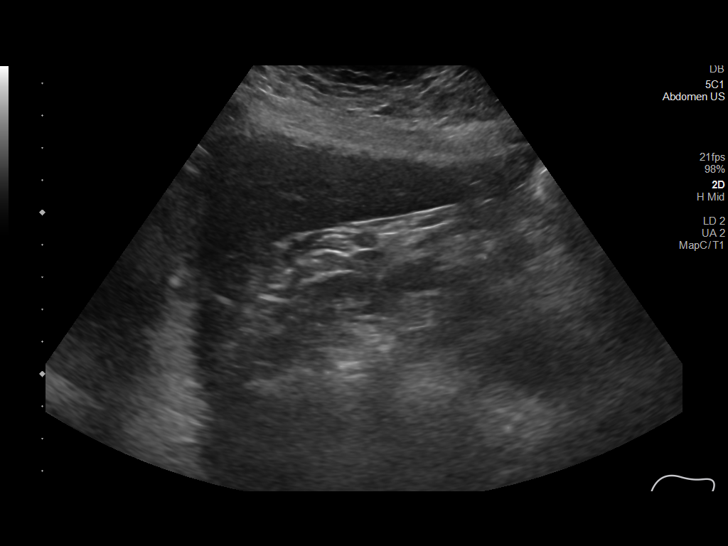
[im 35/103]
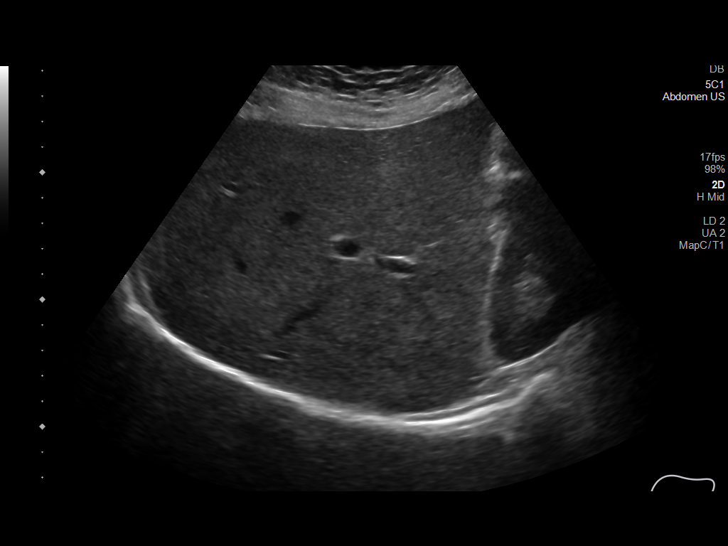
[im 39/103]
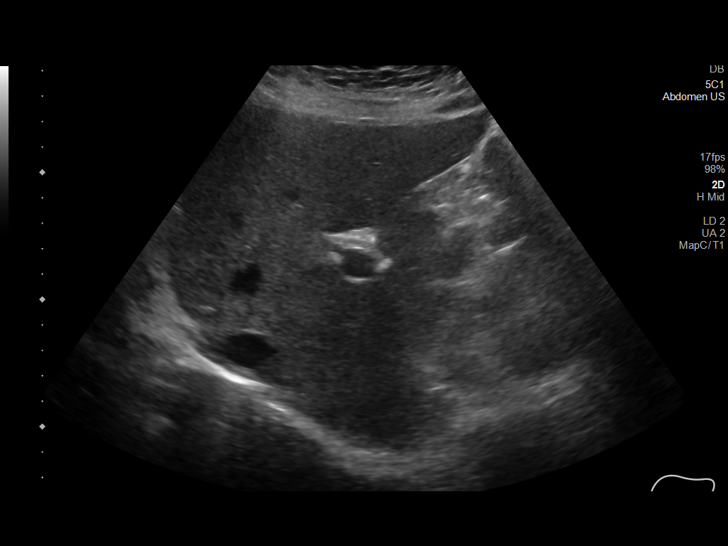
[im 47/103]
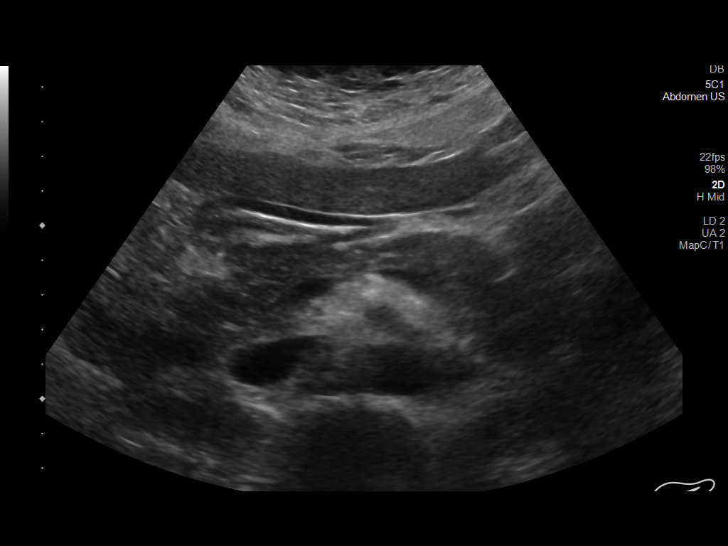
[im 56/103]
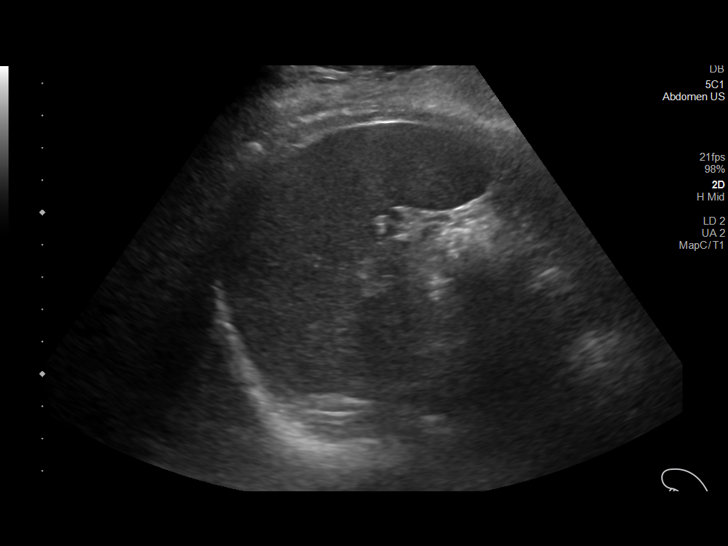
[im 64/103]
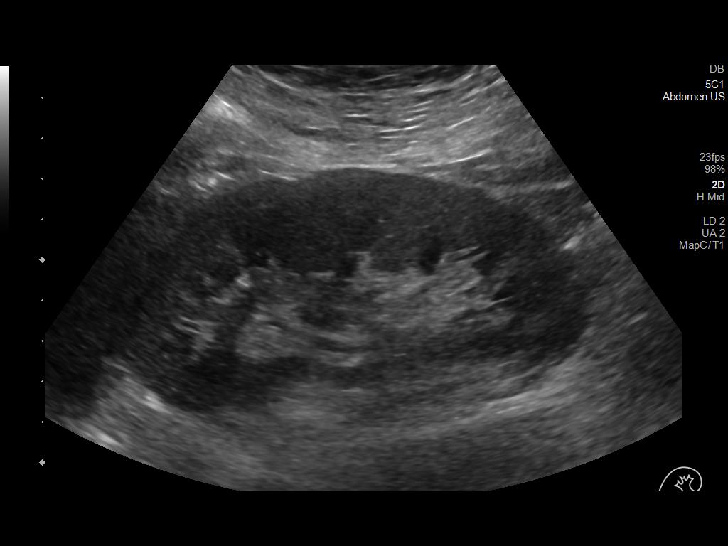
[im 69/103]
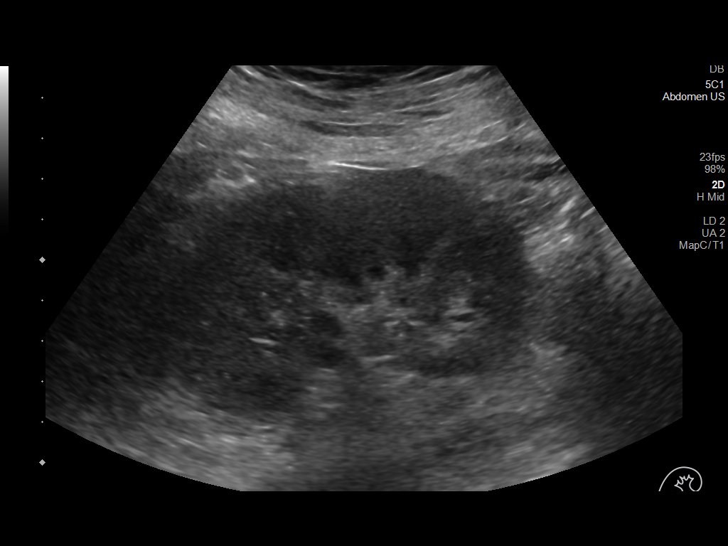
[im 77/103]
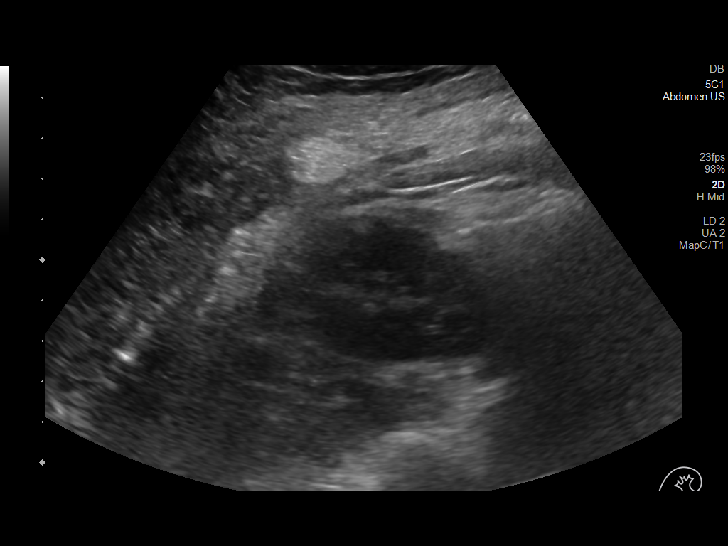
[im 86/103]
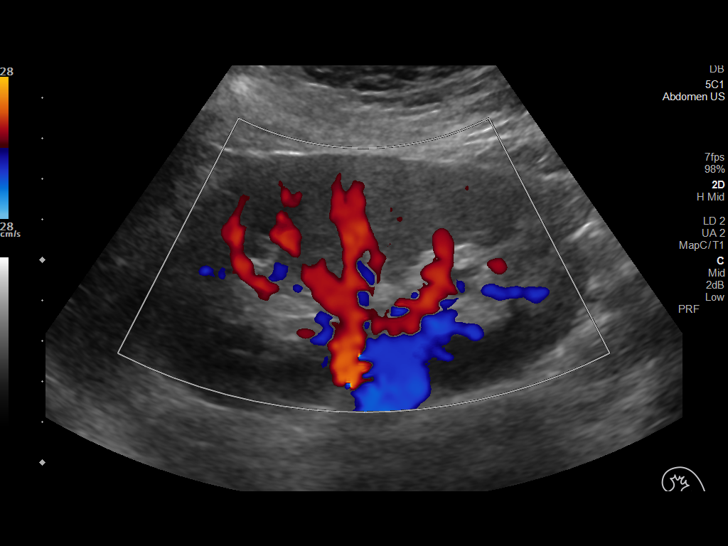
[im 94/103]
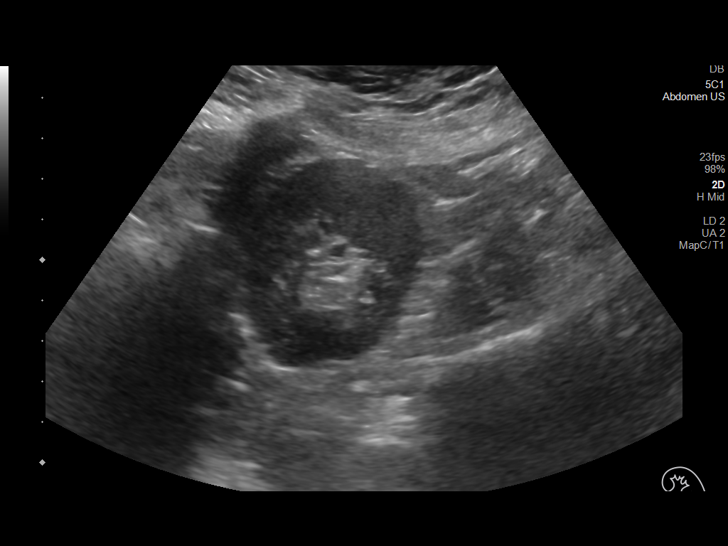
[im 103/103]
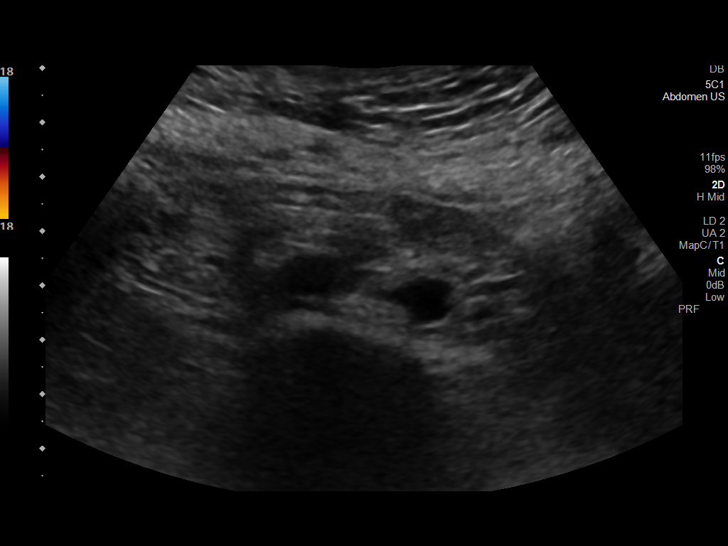

[14 of 25 positions shown; findings below may reference images not displayed]

FINDINGS: Gallbladder: Surgically absent

Common bile duct: Diameter: 3.8 mm

Liver: No focal lesion identified. Within normal limits in
parenchymal echogenicity. Portal vein is patent on color Doppler
imaging with normal direction of blood flow towards the liver.

IVC: No abnormality visualized.

Pancreas: Visualized portion unremarkable.

Spleen: Size and appearance within normal limits.

Right Kidney: Length: 12 cm. Echogenicity within normal limits. No
mass or hydronephrosis visualized.

Left Kidney: Length: 11 cm. Echogenicity within normal limits. No
mass or hydronephrosis visualized.

Abdominal aorta: No aneurysm visualized.

Other findings: None.
IMPRESSION: Status post cholecystectomy. Otherwise negative examination. The
liver appears within normal limits.
